# Patient Record
Sex: Female | Born: 1935 | Race: Black or African American | Hispanic: No | Smoking: Never smoker
Health system: Southern US, Community
[De-identification: ages and names within clinical notes are randomized; demographics above are authoritative.]

## PROBLEM LIST (undated history)

## (undated) DIAGNOSIS — R202 Paresthesia of skin: Secondary | ICD-10-CM

## (undated) DIAGNOSIS — R351 Nocturia: Secondary | ICD-10-CM

## (undated) DIAGNOSIS — M1711 Unilateral primary osteoarthritis, right knee: Secondary | ICD-10-CM

## (undated) DIAGNOSIS — Z972 Presence of dental prosthetic device (complete) (partial): Secondary | ICD-10-CM

## (undated) DIAGNOSIS — R200 Anesthesia of skin: Secondary | ICD-10-CM

## (undated) DIAGNOSIS — M8580 Other specified disorders of bone density and structure, unspecified site: Secondary | ICD-10-CM

## (undated) DIAGNOSIS — Z973 Presence of spectacles and contact lenses: Secondary | ICD-10-CM

## (undated) DIAGNOSIS — I10 Essential (primary) hypertension: Secondary | ICD-10-CM

## (undated) DIAGNOSIS — M1990 Unspecified osteoarthritis, unspecified site: Secondary | ICD-10-CM

## (undated) DIAGNOSIS — G629 Polyneuropathy, unspecified: Secondary | ICD-10-CM

## (undated) DIAGNOSIS — R7303 Prediabetes: Secondary | ICD-10-CM

## (undated) DIAGNOSIS — D649 Anemia, unspecified: Secondary | ICD-10-CM

## (undated) DIAGNOSIS — E7800 Pure hypercholesterolemia, unspecified: Secondary | ICD-10-CM

## (undated) DIAGNOSIS — K219 Gastro-esophageal reflux disease without esophagitis: Secondary | ICD-10-CM

## (undated) DIAGNOSIS — S060X9A Concussion with loss of consciousness of unspecified duration, initial encounter: Secondary | ICD-10-CM

## (undated) DIAGNOSIS — S060XAA Concussion with loss of consciousness status unknown, initial encounter: Secondary | ICD-10-CM

## (undated) DIAGNOSIS — E559 Vitamin D deficiency, unspecified: Secondary | ICD-10-CM

## (undated) DIAGNOSIS — J329 Chronic sinusitis, unspecified: Secondary | ICD-10-CM

## (undated) HISTORY — DX: Other specified disorders of bone density and structure, unspecified site: M85.80

## (undated) HISTORY — DX: Essential (primary) hypertension: I10

## (undated) HISTORY — DX: Unilateral primary osteoarthritis, right knee: M17.11

## (undated) HISTORY — DX: Vitamin D deficiency, unspecified: E55.9

## (undated) HISTORY — DX: Polyneuropathy, unspecified: G62.9

## (undated) HISTORY — DX: Anemia, unspecified: D64.9

## (undated) HISTORY — PX: BUNIONECTOMY: SHX129

## (undated) HISTORY — PX: BREAST SURGERY: SHX581

## (undated) HISTORY — DX: Chronic sinusitis, unspecified: J32.9

## (undated) HISTORY — PX: ABDOMINAL HYSTERECTOMY: SHX81

---

## 1978-11-26 HISTORY — PX: ABDOMINAL HYSTERECTOMY: SHX81

## 2014-09-23 DIAGNOSIS — M25562 Pain in left knee: Secondary | ICD-10-CM | POA: Insufficient documentation

## 2014-10-12 ENCOUNTER — Other Ambulatory Visit: Payer: Self-pay | Admitting: Orthopedic Surgery

## 2014-10-28 ENCOUNTER — Ambulatory Visit (HOSPITAL_COMMUNITY)
Admission: RE | Admit: 2014-10-28 | Discharge: 2014-10-28 | Disposition: A | Discharge status: Home / Self Care | Payer: Commercial Managed Care - HMO | Source: Ambulatory Visit | Attending: Orthopedic Surgery | Admitting: Orthopedic Surgery

## 2014-10-28 ENCOUNTER — Encounter (HOSPITAL_COMMUNITY): Payer: Self-pay

## 2014-10-28 ENCOUNTER — Encounter (HOSPITAL_COMMUNITY)
Admission: RE | Admit: 2014-10-28 | Discharge: 2014-10-28 | Disposition: A | Discharge status: Home / Self Care | Payer: Commercial Managed Care - HMO | Source: Ambulatory Visit | Attending: Orthopedic Surgery | Admitting: Orthopedic Surgery

## 2014-10-28 DIAGNOSIS — Z01818 Encounter for other preprocedural examination: Secondary | ICD-10-CM | POA: Diagnosis present

## 2014-10-28 DIAGNOSIS — I517 Cardiomegaly: Secondary | ICD-10-CM | POA: Insufficient documentation

## 2014-10-28 HISTORY — DX: Presence of dental prosthetic device (complete) (partial): Z97.2

## 2014-10-28 HISTORY — DX: Anesthesia of skin: R20.0

## 2014-10-28 HISTORY — DX: Essential (primary) hypertension: I10

## 2014-10-28 HISTORY — DX: Anesthesia of skin: R20.2

## 2014-10-28 HISTORY — DX: Nocturia: R35.1

## 2014-10-28 HISTORY — DX: Gastro-esophageal reflux disease without esophagitis: K21.9

## 2014-10-28 HISTORY — DX: Presence of spectacles and contact lenses: Z97.3

## 2014-10-28 HISTORY — DX: Concussion with loss of consciousness status unknown, initial encounter: S06.0XAA

## 2014-10-28 HISTORY — DX: Unspecified osteoarthritis, unspecified site: M19.90

## 2014-10-28 HISTORY — DX: Concussion with loss of consciousness of unspecified duration, initial encounter: S06.0X9A

## 2014-10-28 HISTORY — DX: Pure hypercholesterolemia, unspecified: E78.00

## 2014-10-28 LAB — URINALYSIS, ROUTINE W REFLEX MICROSCOPIC
BILIRUBIN URINE: NEGATIVE
Glucose, UA: NEGATIVE mg/dL
KETONES UR: NEGATIVE mg/dL
LEUKOCYTES UA: NEGATIVE
NITRITE: NEGATIVE
PH: 7 (ref 5.0–8.0)
PROTEIN: NEGATIVE mg/dL
Specific Gravity, Urine: 1.014 (ref 1.005–1.030)
UROBILINOGEN UA: 0.2 mg/dL (ref 0.0–1.0)

## 2014-10-28 LAB — COMPREHENSIVE METABOLIC PANEL
ALT: 18 U/L (ref 0–35)
AST: 19 U/L (ref 0–37)
Albumin: 3.7 g/dL (ref 3.5–5.2)
Alkaline Phosphatase: 79 U/L (ref 39–117)
Anion gap: 13 (ref 5–15)
BUN: 21 mg/dL (ref 6–23)
CALCIUM: 9.6 mg/dL (ref 8.4–10.5)
CO2: 25 mEq/L (ref 19–32)
CREATININE: 0.91 mg/dL (ref 0.50–1.10)
Chloride: 102 mEq/L (ref 96–112)
GFR calc non Af Amer: 58 mL/min — ABNORMAL LOW (ref >90)
GFR, EST AFRICAN AMERICAN: 67 mL/min — AB (ref >90)
GLUCOSE: 101 mg/dL — AB (ref 70–99)
Potassium: 4.1 mEq/L (ref 3.7–5.3)
SODIUM: 140 meq/L (ref 137–147)
Total Bilirubin: <0.2 mg/dL — ABNORMAL LOW (ref 0.3–1.2)
Total Protein: 7.2 g/dL (ref 6.0–8.3)

## 2014-10-28 LAB — SURGICAL PCR SCREEN
MRSA, PCR: NEGATIVE
STAPHYLOCOCCUS AUREUS: POSITIVE — AB

## 2014-10-28 LAB — CBC WITH DIFFERENTIAL/PLATELET
Basophils Absolute: 0 10*3/uL (ref 0.0–0.1)
Basophils Relative: 0 % (ref 0–1)
EOS PCT: 0 % (ref 0–5)
Eosinophils Absolute: 0 10*3/uL (ref 0.0–0.7)
HCT: 39.9 % (ref 36.0–46.0)
Hemoglobin: 13.1 g/dL (ref 12.0–15.0)
Lymphocytes Relative: 25 % (ref 12–46)
Lymphs Abs: 1.7 10*3/uL (ref 0.7–4.0)
MCH: 29.6 pg (ref 26.0–34.0)
MCHC: 32.8 g/dL (ref 30.0–36.0)
MCV: 90.3 fL (ref 78.0–100.0)
MONO ABS: 0.4 10*3/uL (ref 0.1–1.0)
Monocytes Relative: 5 % (ref 3–12)
Neutro Abs: 4.7 10*3/uL (ref 1.7–7.7)
Neutrophils Relative %: 70 % (ref 43–77)
PLATELETS: 184 10*3/uL (ref 150–400)
RBC: 4.42 MIL/uL (ref 3.87–5.11)
RDW: 13.2 % (ref 11.5–15.5)
WBC: 6.9 10*3/uL (ref 4.0–10.5)

## 2014-10-28 LAB — URINE MICROSCOPIC-ADD ON

## 2014-10-28 LAB — PROTIME-INR
INR: 0.9 (ref 0.00–1.49)
PROTHROMBIN TIME: 12.3 s (ref 11.6–15.2)

## 2014-10-28 LAB — APTT: aPTT: 32 seconds (ref 24–37)

## 2014-10-28 NOTE — Pre-Procedure Instructions (Signed)
Laura Rocha  10/28/2014   Your procedure is scheduled on: Monday, November 08, 2014  [facility] at 5:30 AM.  Call this number if you have problems the morning of surgery: [phone]   Remember:   Do not eat food or drink liquids after midnight Sunday, November 07, 2014   Take these medicines the morning of surgery with A SIP OF WATER: amlodipine Stop taking Aspirin, vitamins and herbal medications. Do not take any NSAIDs ie: Ibuprofen, Advil, Naproxen or any medication containing Aspirin;stop 5 days prior to procedure ( Wednesday, November 03, 2014).  Do not wear jewelry, make-up or nail polish.  Do not wear lotions, powders, or perfumes. You may not wear deodorant.  Do not shave 48 hours prior to surgery.   Do not bring valuables to the hospital.  [facility] is not responsible for any belongings or valuables.               Contacts, dentures or bridgework may not be worn into surgery.  Leave suitcase in the car. After surgery it may be brought to your room.  For patients admitted to the hospital, discharge time is determined by your treatment team.               Patients discharged the day of surgery will not be allowed to drive home.  Name and phone number of your driver:   Special Instructions:  Special Instructions:Special Instructions:  - Preparing for Surgery  Before surgery, you can play an important role.  Because skin is not sterile, your skin needs to be as free of germs as possible.  You can reduce the number of germs on you skin by washing with CHG (chlorahexidine gluconate) soap before surgery.  CHG is an antiseptic cleaner which kills germs and bonds with the skin to continue killing germs even after washing.  Please DO NOT use if you have an allergy to CHG or antibacterial soaps.  If your skin becomes reddened/irritated stop using the CHG and inform your nurse when you arrive at Short Stay.  Do not shave (including legs and  underarms) for at least 48 hours prior to the first CHG shower.  You may shave your face.  Please follow these instructions carefully:   1.  Shower with CHG Soap the night before surgery and the morning of Surgery.  2.  If you choose to wash your hair, wash your hair first as usual with your normal shampoo.  3.  After you shampoo, rinse your hair and body thoroughly to remove the Shampoo.  4.  Use CHG as you would any other liquid soap.  You can apply chg directly  to the skin and wash gently with scrungie or a clean washcloth.  5.  Apply the CHG Soap to your body ONLY FROM THE NECK DOWN.  Do not use on open wounds or open sores.  Avoid contact with your eyes, ears, mouth and genitals (private parts).  Wash genitals (private parts) with your normal soap.  6.  Wash thoroughly, paying special attention to the area where your surgery will be performed.  7.  Thoroughly rinse your body with warm water from the neck down.  8.  DO NOT shower/wash with your normal soap after using and rinsing off the CHG Soap.  9.  Pat yourself dry with a clean towel.            10 .  Wear clean pajamas.  11.  Place clean sheets on your bed the night of your first shower and do not sleep with pets.  Day of Surgery  Do not apply any lotions/deodorants the morning of surgery.  Please wear clean clothes to the hospital/surgery center.   Please read over the following fact sheets that you were given: Pain Booklet, Coughing and Deep Breathing, Blood Transfusion Information, Total Joint Packet, MRSA Information and Surgical Site Infection Prevention

## 2014-10-28 NOTE — Progress Notes (Signed)
Pt denies SOB, chest pain, and being under the care of a cardiologist. Pt denies having a chest x ray, EKG, stress test, echo and cardiac cath.

## 2014-10-29 DIAGNOSIS — M1712 Unilateral primary osteoarthritis, left knee: Secondary | ICD-10-CM | POA: Insufficient documentation

## 2014-10-29 LAB — URINE CULTURE
CULTURE: NO GROWTH
Colony Count: NO GROWTH

## 2014-10-29 MED ORDER — NITROGLYCERIN 1 MG/10 ML FOR IR/CATH LAB
INTRA_ARTERIAL | Status: AC
  Filled 2014-10-29: qty 10

## 2014-10-29 MED ORDER — MIDAZOLAM HCL 2 MG/2ML IJ SOLN
INTRAMUSCULAR | Status: AC
  Filled 2014-10-29: qty 2

## 2014-10-29 MED ORDER — HEPARIN SODIUM (PORCINE) 1000 UNIT/ML IJ SOLN
INTRAMUSCULAR | Status: AC
  Filled 2014-10-29: qty 1

## 2014-10-29 MED ORDER — LIDOCAINE HCL (PF) 1 % IJ SOLN
INTRAMUSCULAR | Status: AC
  Filled 2014-10-29: qty 30

## 2014-10-29 MED ORDER — VERAPAMIL HCL 2.5 MG/ML IV SOLN
INTRAVENOUS | Status: AC
  Filled 2014-10-29: qty 2

## 2014-10-29 MED ORDER — HEPARIN (PORCINE) IN NACL 2-0.9 UNIT/ML-% IJ SOLN
INTRAMUSCULAR | Status: AC
  Filled 2014-10-29: qty 1000

## 2014-10-29 MED ORDER — FENTANYL CITRATE 0.05 MG/ML IJ SOLN
INTRAMUSCULAR | Status: AC
  Filled 2014-10-29: qty 2

## 2014-11-07 MED ORDER — CEFAZOLIN SODIUM-DEXTROSE 2-3 GM-% IV SOLR
2.0000 g | INTRAVENOUS | Status: AC
  Administered 2014-11-08: 2 g via INTRAVENOUS
  Filled 2014-11-07: qty 50

## 2014-11-07 MED ORDER — TRANEXAMIC ACID 100 MG/ML IV SOLN
1000.0000 mg | INTRAVENOUS | Status: AC
  Administered 2014-11-08: 1000 mg via INTRAVENOUS
  Filled 2014-11-07: qty 10

## 2014-11-08 ENCOUNTER — Inpatient Hospital Stay (HOSPITAL_COMMUNITY): Payer: Medicare HMO | Admitting: Anesthesiology

## 2014-11-08 ENCOUNTER — Inpatient Hospital Stay (HOSPITAL_COMMUNITY)
Admission: RE | Admit: 2014-11-08 | Discharge: 2014-11-09 | DRG: 470 | Disposition: A | Discharge status: Home / Self Care | Payer: Medicare HMO | Source: Ambulatory Visit | Attending: Orthopedic Surgery | Admitting: Orthopedic Surgery

## 2014-11-08 ENCOUNTER — Encounter (HOSPITAL_COMMUNITY)
Admission: RE | Disposition: A | Discharge status: Home / Self Care | Payer: Self-pay | Source: Ambulatory Visit | Attending: Orthopedic Surgery

## 2014-11-08 DIAGNOSIS — M1712 Unilateral primary osteoarthritis, left knee: Principal | ICD-10-CM | POA: Diagnosis present

## 2014-11-08 DIAGNOSIS — E780 Pure hypercholesterolemia: Secondary | ICD-10-CM | POA: Diagnosis present

## 2014-11-08 DIAGNOSIS — K219 Gastro-esophageal reflux disease without esophagitis: Secondary | ICD-10-CM | POA: Diagnosis present

## 2014-11-08 DIAGNOSIS — D62 Acute posthemorrhagic anemia: Secondary | ICD-10-CM | POA: Diagnosis not present

## 2014-11-08 DIAGNOSIS — I10 Essential (primary) hypertension: Secondary | ICD-10-CM | POA: Diagnosis present

## 2014-11-08 DIAGNOSIS — M25562 Pain in left knee: Secondary | ICD-10-CM | POA: Diagnosis present

## 2014-11-08 DIAGNOSIS — Z96659 Presence of unspecified artificial knee joint: Secondary | ICD-10-CM

## 2014-11-08 HISTORY — PX: TOTAL KNEE ARTHROPLASTY: SHX125

## 2014-11-08 LAB — CBC
HCT: 36.3 % (ref 36.0–46.0)
Hemoglobin: 12.3 g/dL (ref 12.0–15.0)
MCH: 31.1 pg (ref 26.0–34.0)
MCHC: 33.9 g/dL (ref 30.0–36.0)
MCV: 91.9 fL (ref 78.0–100.0)
PLATELETS: 161 10*3/uL (ref 150–400)
RBC: 3.95 MIL/uL (ref 3.87–5.11)
RDW: 13.3 % (ref 11.5–15.5)
WBC: 10.4 10*3/uL (ref 4.0–10.5)

## 2014-11-08 LAB — CREATININE, SERUM
CREATININE: 0.65 mg/dL (ref 0.50–1.10)
GFR calc Af Amer: >90 mL/min (ref >90)
GFR calc non Af Amer: 82 mL/min — ABNORMAL LOW (ref >90)

## 2014-11-08 SURGERY — ARTHROPLASTY, KNEE, TOTAL
Anesthesia: Monitor Anesthesia Care | Site: Knee | Laterality: Left

## 2014-11-08 MED ORDER — OXYCODONE HCL 5 MG PO TABS
5.0000 mg | ORAL_TABLET | ORAL | Status: DC | PRN
  Administered 2014-11-08 (×2): 5 mg via ORAL
  Administered 2014-11-09: 10 mg via ORAL
  Administered 2014-11-09: 5 mg via ORAL
  Administered 2014-11-09 (×2): 10 mg via ORAL
  Filled 2014-11-08: qty 2
  Filled 2014-11-08: qty 1
  Filled 2014-11-08: qty 2
  Filled 2014-11-08 (×2): qty 1
  Filled 2014-11-08: qty 2

## 2014-11-08 MED ORDER — DOCUSATE SODIUM 100 MG PO CAPS
100.0000 mg | ORAL_CAPSULE | Freq: Two times a day (BID) | ORAL | Status: DC
  Administered 2014-11-09: 100 mg via ORAL
  Filled 2014-11-08 (×2): qty 1

## 2014-11-08 MED ORDER — DIPHENHYDRAMINE HCL 12.5 MG/5ML PO ELIX: 12.5000 mg | ORAL_SOLUTION | ORAL | Status: DC | PRN

## 2014-11-08 MED ORDER — PROMETHAZINE HCL 25 MG/ML IJ SOLN
6.2500 mg | INTRAMUSCULAR | Status: DC | PRN
  Administered 2014-11-08: 6.25 mg via INTRAVENOUS

## 2014-11-08 MED ORDER — CELECOXIB 200 MG PO CAPS
200.0000 mg | ORAL_CAPSULE | Freq: Two times a day (BID) | ORAL | Status: DC
  Administered 2014-11-08 – 2014-11-09 (×2): 200 mg via ORAL
  Filled 2014-11-08 (×3): qty 1

## 2014-11-08 MED ORDER — METHOCARBAMOL 1000 MG/10ML IJ SOLN
500.0000 mg | Freq: Four times a day (QID) | INTRAVENOUS | Status: DC | PRN
  Filled 2014-11-08: qty 5

## 2014-11-08 MED ORDER — FENTANYL CITRATE 0.05 MG/ML IJ SOLN
INTRAMUSCULAR | Status: AC
  Filled 2014-11-08: qty 5

## 2014-11-08 MED ORDER — SODIUM CHLORIDE 0.9 % IV SOLN
INTRAVENOUS | Status: DC
  Administered 2014-11-09: 06:00:00 via INTRAVENOUS

## 2014-11-08 MED ORDER — ACETAMINOPHEN 325 MG PO TABS: 650.0000 mg | ORAL_TABLET | Freq: Four times a day (QID) | ORAL | Status: DC | PRN

## 2014-11-08 MED ORDER — METHOCARBAMOL 500 MG PO TABS
500.0000 mg | ORAL_TABLET | Freq: Four times a day (QID) | ORAL | Status: DC | PRN
  Administered 2014-11-08: 500 mg via ORAL
  Filled 2014-11-08 (×2): qty 1

## 2014-11-08 MED ORDER — CHLORHEXIDINE GLUCONATE 4 % EX LIQD
60.0000 mL | Freq: Once | CUTANEOUS | Status: DC
Start: 2014-11-08 — End: 2014-11-08

## 2014-11-08 MED ORDER — METOCLOPRAMIDE HCL 5 MG/ML IJ SOLN
INTRAMUSCULAR | Status: AC
  Filled 2014-11-08: qty 2

## 2014-11-08 MED ORDER — AMLODIPINE BESYLATE 10 MG PO TABS
10.0000 mg | ORAL_TABLET | Freq: Every day | ORAL | Status: DC
  Administered 2014-11-09: 10 mg via ORAL
  Filled 2014-11-08: qty 1

## 2014-11-08 MED ORDER — ZOLPIDEM TARTRATE 5 MG PO TABS: 5.0000 mg | ORAL_TABLET | Freq: Every evening | ORAL | Status: DC | PRN

## 2014-11-08 MED ORDER — BISACODYL 5 MG PO TBEC: 5.0000 mg | DELAYED_RELEASE_TABLET | Freq: Every day | ORAL | Status: DC | PRN

## 2014-11-08 MED ORDER — PROPOFOL 10 MG/ML IV BOLUS
INTRAVENOUS | Status: AC
  Filled 2014-11-08: qty 20

## 2014-11-08 MED ORDER — ONDANSETRON HCL 4 MG/2ML IJ SOLN
INTRAMUSCULAR | Status: DC | PRN
  Administered 2014-11-08: 4 mg via INTRAVENOUS

## 2014-11-08 MED ORDER — PHENOL 1.4 % MT LIQD: 1.0000 | OROMUCOSAL | Status: DC | PRN

## 2014-11-08 MED ORDER — METOCLOPRAMIDE HCL 5 MG/ML IJ SOLN: 5.0000 mg | Freq: Once | INTRAMUSCULAR | Status: DC

## 2014-11-08 MED ORDER — FLEET ENEMA 7-19 GM/118ML RE ENEM: 1.0000 | ENEMA | Freq: Once | RECTAL | Status: AC | PRN

## 2014-11-08 MED ORDER — HYDROMORPHONE HCL 1 MG/ML IJ SOLN: 1.0000 mg | INTRAMUSCULAR | Status: DC | PRN

## 2014-11-08 MED ORDER — ROCURONIUM BROMIDE 50 MG/5ML IV SOLN
INTRAVENOUS | Status: AC
  Filled 2014-11-08: qty 1

## 2014-11-08 MED ORDER — LACTATED RINGERS IV SOLN
INTRAVENOUS | Status: DC | PRN
  Administered 2014-11-08 (×2): via INTRAVENOUS

## 2014-11-08 MED ORDER — METOCLOPRAMIDE HCL 5 MG PO TABS
5.0000 mg | ORAL_TABLET | Freq: Three times a day (TID) | ORAL | Status: DC | PRN
Start: 2014-11-08 — End: 2014-11-09
  Filled 2014-11-08: qty 2

## 2014-11-08 MED ORDER — SODIUM CHLORIDE 0.9 % IR SOLN
Status: DC | PRN
  Administered 2014-11-08: 3000 mL

## 2014-11-08 MED ORDER — HYDROMORPHONE HCL 1 MG/ML IJ SOLN
INTRAMUSCULAR | Status: AC
  Administered 2014-11-08: 14:00:00
  Filled 2014-11-08: qty 1

## 2014-11-08 MED ORDER — FENTANYL CITRATE 0.05 MG/ML IJ SOLN
INTRAMUSCULAR | Status: DC | PRN
  Administered 2014-11-08 (×2): 25 ug via INTRAVENOUS
  Administered 2014-11-08 (×2): 50 ug via INTRAVENOUS

## 2014-11-08 MED ORDER — BUPIVACAINE-EPINEPHRINE 0.5% -1:200000 IJ SOLN
INTRAMUSCULAR | Status: DC | PRN
  Administered 2014-11-08: 20 mL

## 2014-11-08 MED ORDER — ACETAMINOPHEN 650 MG RE SUPP: 650.0000 mg | Freq: Four times a day (QID) | RECTAL | Status: DC | PRN

## 2014-11-08 MED ORDER — SENNOSIDES-DOCUSATE SODIUM 8.6-50 MG PO TABS: 1.0000 | ORAL_TABLET | Freq: Every evening | ORAL | Status: DC | PRN

## 2014-11-08 MED ORDER — ONDANSETRON HCL 4 MG PO TABS
4.0000 mg | ORAL_TABLET | Freq: Four times a day (QID) | ORAL | Status: DC | PRN
  Filled 2014-11-08: qty 1

## 2014-11-08 MED ORDER — BUPIVACAINE LIPOSOME 1.3 % IJ SUSP
20.0000 mL | Freq: Once | INTRAMUSCULAR | Status: AC
  Administered 2014-11-08: 20 mL
  Filled 2014-11-08: qty 20

## 2014-11-08 MED ORDER — LIDOCAINE HCL (CARDIAC) 20 MG/ML IV SOLN
INTRAVENOUS | Status: AC
  Filled 2014-11-08: qty 5

## 2014-11-08 MED ORDER — BUPIVACAINE LIPOSOME 1.3 % IJ SUSP: 20.0000 mL | Freq: Once | INTRAMUSCULAR | Status: DC

## 2014-11-08 MED ORDER — PROPOFOL INFUSION 10 MG/ML OPTIME
INTRAVENOUS | Status: DC | PRN
  Administered 2014-11-08: 50 ug/kg/min via INTRAVENOUS

## 2014-11-08 MED ORDER — CEFAZOLIN SODIUM 1-5 GM-% IV SOLN
1.0000 g | Freq: Four times a day (QID) | INTRAVENOUS | Status: AC
  Administered 2014-11-08 (×2): 1 g via INTRAVENOUS
  Filled 2014-11-08 (×2): qty 50

## 2014-11-08 MED ORDER — PROMETHAZINE HCL 25 MG/ML IJ SOLN
INTRAMUSCULAR | Status: AC
  Administered 2014-11-08: 13:00:00
  Filled 2014-11-08: qty 1

## 2014-11-08 MED ORDER — ENOXAPARIN SODIUM 30 MG/0.3ML ~~LOC~~ SOLN
30.0000 mg | Freq: Two times a day (BID) | SUBCUTANEOUS | Status: DC
  Administered 2014-11-09: 30 mg via SUBCUTANEOUS
  Filled 2014-11-08 (×3): qty 0.3

## 2014-11-08 MED ORDER — SODIUM CHLORIDE 0.9 % IV SOLN: INTRAVENOUS | Status: DC

## 2014-11-08 MED ORDER — CHLORHEXIDINE GLUCONATE 4 % EX LIQD: 60.0000 mL | Freq: Once | CUTANEOUS | Status: DC

## 2014-11-08 MED ORDER — ONDANSETRON HCL 4 MG/2ML IJ SOLN
4.0000 mg | Freq: Four times a day (QID) | INTRAMUSCULAR | Status: DC | PRN
  Administered 2014-11-08: 4 mg via INTRAVENOUS
  Filled 2014-11-08 (×2): qty 2

## 2014-11-08 MED ORDER — ONDANSETRON HCL 4 MG/2ML IJ SOLN
INTRAMUSCULAR | Status: AC
  Filled 2014-11-08: qty 2

## 2014-11-08 MED ORDER — METOCLOPRAMIDE HCL 5 MG/ML IJ SOLN
5.0000 mg | Freq: Three times a day (TID) | INTRAMUSCULAR | Status: DC | PRN
  Filled 2014-11-08: qty 2

## 2014-11-08 MED ORDER — ALUM & MAG HYDROXIDE-SIMETH 200-200-20 MG/5ML PO SUSP
30.0000 mL | ORAL | Status: DC | PRN
Start: 2014-11-08 — End: 2014-11-09

## 2014-11-08 MED ORDER — MENTHOL 3 MG MT LOZG: 1.0000 | LOZENGE | OROMUCOSAL | Status: DC | PRN

## 2014-11-08 MED ORDER — BUPIVACAINE-EPINEPHRINE (PF) 0.5% -1:200000 IJ SOLN
INTRAMUSCULAR | Status: AC
  Filled 2014-11-08: qty 30

## 2014-11-08 MED ORDER — OXYCODONE HCL ER 10 MG PO T12A
10.0000 mg | EXTENDED_RELEASE_TABLET | Freq: Two times a day (BID) | ORAL | Status: DC
  Administered 2014-11-09: 10 mg via ORAL
  Filled 2014-11-08: qty 1

## 2014-11-08 MED ORDER — HYDROMORPHONE HCL 1 MG/ML IJ SOLN
0.2500 mg | INTRAMUSCULAR | Status: DC | PRN
  Administered 2014-11-08: 0.5 mg via INTRAVENOUS

## 2014-11-08 SURGICAL SUPPLY — 58 items
BANDAGE ESMARK 6X9 LF (GAUZE/BANDAGES/DRESSINGS) ×1 IMPLANT
BLADE SAGITTAL 13X1.27X60 (BLADE) ×2 IMPLANT
BLADE SAW SGTL 83.5X18.5 (BLADE) ×2 IMPLANT
BLADE SURG 10 STRL SS (BLADE) ×2 IMPLANT
BNDG ESMARK 6X9 LF (GAUZE/BANDAGES/DRESSINGS) ×2
BOWL SMART MIX CTS (DISPOSABLE) ×2 IMPLANT
CAPT KNEE TOTAL 3 ×2 IMPLANT
CEMENT BONE SIMPLEX SPEEDSET (Cement) ×4 IMPLANT
COVER SURGICAL LIGHT HANDLE (MISCELLANEOUS) ×2 IMPLANT
CUFF TOURNIQUET SINGLE 34IN LL (TOURNIQUET CUFF) ×2 IMPLANT
DRAPE EXTREMITY T 121X128X90 (DRAPE) ×2 IMPLANT
DRAPE IMP U-DRAPE 54X76 (DRAPES) ×2 IMPLANT
DRAPE INCISE IOBAN 66X45 STRL (DRAPES) ×4 IMPLANT
DRAPE PROXIMA HALF (DRAPES) IMPLANT
DRAPE U-SHAPE 47X51 STRL (DRAPES) ×2 IMPLANT
DRSG ADAPTIC 3X8 NADH LF (GAUZE/BANDAGES/DRESSINGS) ×2 IMPLANT
DRSG PAD ABDOMINAL 8X10 ST (GAUZE/BANDAGES/DRESSINGS) ×2 IMPLANT
DURAPREP 26ML APPLICATOR (WOUND CARE) ×4 IMPLANT
ELECT REM PT RETURN 9FT ADLT (ELECTROSURGICAL) ×2
ELECTRODE REM PT RTRN 9FT ADLT (ELECTROSURGICAL) ×1 IMPLANT
EVACUATOR 1/8 PVC DRAIN (DRAIN) ×2 IMPLANT
GAUZE SPONGE 4X4 12PLY STRL (GAUZE/BANDAGES/DRESSINGS) ×2 IMPLANT
GLOVE BIOGEL M 7.0 STRL (GLOVE) IMPLANT
GLOVE BIOGEL PI IND STRL 7.5 (GLOVE) IMPLANT
GLOVE BIOGEL PI IND STRL 8.5 (GLOVE) ×2 IMPLANT
GLOVE BIOGEL PI INDICATOR 7.5 (GLOVE)
GLOVE BIOGEL PI INDICATOR 8.5 (GLOVE) ×2
GLOVE SURG ORTHO 8.0 STRL STRW (GLOVE) ×4 IMPLANT
GOWN STRL REUS W/ TWL LRG LVL3 (GOWN DISPOSABLE) ×1 IMPLANT
GOWN STRL REUS W/ TWL XL LVL3 (GOWN DISPOSABLE) ×2 IMPLANT
GOWN STRL REUS W/TWL LRG LVL3 (GOWN DISPOSABLE) ×1
GOWN STRL REUS W/TWL XL LVL3 (GOWN DISPOSABLE) ×2
HANDPIECE INTERPULSE COAX TIP (DISPOSABLE) ×1
HOOD PEEL AWAY FACE SHEILD DIS (HOOD) ×6 IMPLANT
KIT BASIN OR (CUSTOM PROCEDURE TRAY) ×2 IMPLANT
KIT ROOM TURNOVER OR (KITS) ×2 IMPLANT
KNEE CAPITATED TOTAL 3 ×1 IMPLANT
MANIFOLD NEPTUNE II (INSTRUMENTS) ×2 IMPLANT
NEEDLE 22X1 1/2 (OR ONLY) (NEEDLE) ×4 IMPLANT
NS IRRIG 1000ML POUR BTL (IV SOLUTION) ×2 IMPLANT
PACK TOTAL JOINT (CUSTOM PROCEDURE TRAY) ×2 IMPLANT
PACK UNIVERSAL I (CUSTOM PROCEDURE TRAY) ×2 IMPLANT
PAD ARMBOARD 7.5X6 YLW CONV (MISCELLANEOUS) ×4 IMPLANT
PADDING CAST COTTON 6X4 STRL (CAST SUPPLIES) ×2 IMPLANT
SET HNDPC FAN SPRY TIP SCT (DISPOSABLE) ×1 IMPLANT
SPONGE GAUZE 4X4 12PLY STER LF (GAUZE/BANDAGES/DRESSINGS) ×2 IMPLANT
STAPLER VISISTAT 35W (STAPLE) ×2 IMPLANT
SUCTION FRAZIER TIP 10 FR DISP (SUCTIONS) ×2 IMPLANT
SUT BONE WAX W31G (SUTURE) ×2 IMPLANT
SUT VIC AB 0 CTB1 27 (SUTURE) ×4 IMPLANT
SUT VIC AB 1 CT1 27 (SUTURE) ×2
SUT VIC AB 1 CT1 27XBRD ANBCTR (SUTURE) ×2 IMPLANT
SUT VIC AB 2-0 CT1 27 (SUTURE) ×2
SUT VIC AB 2-0 CT1 TAPERPNT 27 (SUTURE) ×2 IMPLANT
SYR 20CC LL (SYRINGE) ×4 IMPLANT
TOWEL OR 17X24 6PK STRL BLUE (TOWEL DISPOSABLE) ×2 IMPLANT
TOWEL OR 17X26 10 PK STRL BLUE (TOWEL DISPOSABLE) ×2 IMPLANT
WATER STERILE IRR 1000ML POUR (IV SOLUTION) ×4 IMPLANT

## 2014-11-08 NOTE — Transfer of Care (Signed)
Immediate Anesthesia Transfer of Care Note  Patient: Laura IceElizabeth Rocha  Procedure(s) Performed: Procedure(s): LEFT TOTAL KNEE ARTHROPLASTY (Left)  Patient Location: PACU  Anesthesia Type:MAC and Spinal  Level of Consciousness: awake, alert  and oriented  Airway & Oxygen Therapy: Patient Spontanous Breathing  Post-op Assessment: Report given to PACU RN  Post vital signs: Reviewed and stable  Complications: No apparent anesthesia complications

## 2014-11-08 NOTE — Anesthesia Preprocedure Evaluation (Signed)
Anesthesia Evaluation  Patient identified by MRN, date of birth, ID band Patient awake    Reviewed: Allergy & Precautions, H&P , NPO status , Patient's Chart, lab work & pertinent test results  Airway Mallampati: II  TM Distance: >3 FB Neck ROM: Full    Dental no notable dental hx.    Pulmonary neg pulmonary ROS,  breath sounds clear to auscultation  Pulmonary exam normal       Cardiovascular hypertension, Rhythm:Regular Rate:Normal     Neuro/Psych negative neurological ROS  negative psych ROS   GI/Hepatic negative GI ROS, Neg liver ROS,   Endo/Other  negative endocrine ROS  Renal/GU negative Renal ROS  negative genitourinary   Musculoskeletal negative musculoskeletal ROS (+)   Abdominal   Peds negative pediatric ROS (+)  Hematology negative hematology ROS (+)   Anesthesia Other Findings   Reproductive/Obstetrics negative OB ROS                             Anesthesia Physical Anesthesia Plan  ASA: II  Anesthesia Plan: Spinal   Post-op Pain Management:    Induction: Intravenous  Airway Management Planned: Nasal Cannula  Additional Equipment:   Intra-op Plan:   Post-operative Plan:   Informed Consent: I have reviewed the patients History and Physical, chart, labs and discussed the procedure including the risks, benefits and alternatives for the proposed anesthesia with the patient or authorized representative who has indicated his/her understanding and acceptance.   Dental advisory given  Plan Discussed with: CRNA  Anesthesia Plan Comments:         Anesthesia Quick Evaluation

## 2014-11-08 NOTE — Anesthesia Procedure Notes (Signed)
Spinal Patient location during procedure: OR Staffing Performed by: anesthesiologist  Preanesthetic Checklist Completed: patient identified, site marked, surgical consent, pre-op evaluation, timeout performed, IV checked, risks and benefits discussed and monitors and equipment checked Spinal Block Patient position: sitting Prep: Betadine Patient monitoring: heart rate, continuous pulse ox and blood pressure Injection technique: single-shot Needle Needle type: Sprotte  Needle gauge: 22 G Needle length: 9 cm Additional Notes Expiration date of kit checked and confirmed. Patient tolerated procedure well, without complications.   Attempt x 3

## 2014-11-08 NOTE — Anesthesia Postprocedure Evaluation (Signed)
  Anesthesia Post-op Note  Patient: Laura Rocha Case  Procedure(s) Performed: Procedure(s) (LRB): LEFT TOTAL KNEE ARTHROPLASTY (Left)  Patient Location: PACU  Anesthesia Type: Spinal  Level of Consciousness: awake and alert   Airway and Oxygen Therapy: Patient Spontanous Breathing  Post-op Pain: mild  Post-op Assessment: Post-op Vital signs reviewed, Patient's Cardiovascular Status Stable, Respiratory Function Stable, Patent Airway and No signs of Nausea or vomiting  Last Vitals:  Filed Vitals:   11/08/14 1000  BP:   Pulse: 77  Temp:   Resp: 15    Post-op Vital Signs: stable   Complications: No apparent anesthesia complications

## 2014-11-08 NOTE — Progress Notes (Signed)
Utilization review completed.  
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
## 2014-11-08 NOTE — Progress Notes (Signed)
Orthopedic Tech Progress Note Patient Details:  Laura Rocha [birth year] [id number] CPM applied to LLE with appropriate settings. OHF applied to bed. Footsie roll provided.  CPM Left Knee CPM Left Knee: On Left Knee Flexion (Degrees): 90 Left Knee Extension (Degrees): 0   Asia R Thompson 11/08/2014, 10:25 AM

## 2014-11-09 ENCOUNTER — Encounter (HOSPITAL_COMMUNITY): Payer: Self-pay | Admitting: General Practice

## 2014-11-09 LAB — CBC
HCT: 34.9 % — ABNORMAL LOW (ref 36.0–46.0)
HEMOGLOBIN: 11.5 g/dL — AB (ref 12.0–15.0)
MCH: 29.6 pg (ref 26.0–34.0)
MCHC: 33 g/dL (ref 30.0–36.0)
MCV: 89.7 fL (ref 78.0–100.0)
Platelets: 168 10*3/uL (ref 150–400)
RBC: 3.89 MIL/uL (ref 3.87–5.11)
RDW: 13.6 % (ref 11.5–15.5)
WBC: 11.3 10*3/uL — ABNORMAL HIGH (ref 4.0–10.5)

## 2014-11-09 LAB — BASIC METABOLIC PANEL
ANION GAP: 15 (ref 5–15)
BUN: 12 mg/dL (ref 6–23)
CHLORIDE: 100 meq/L (ref 96–112)
CO2: 25 mEq/L (ref 19–32)
Calcium: 9.2 mg/dL (ref 8.4–10.5)
Creatinine, Ser: 0.7 mg/dL (ref 0.50–1.10)
GFR calc non Af Amer: 80 mL/min — ABNORMAL LOW (ref >90)
Glucose, Bld: 113 mg/dL — ABNORMAL HIGH (ref 70–99)
Potassium: 3.5 mEq/L — ABNORMAL LOW (ref 3.7–5.3)
Sodium: 140 mEq/L (ref 137–147)

## 2014-11-09 MED ORDER — ENOXAPARIN SODIUM 40 MG/0.4ML ~~LOC~~ SOLN: 40.0000 mg | SUBCUTANEOUS | Status: DC

## 2014-11-09 MED ORDER — IBUPROFEN 600 MG PO TABS: 600.0000 mg | ORAL_TABLET | Freq: Three times a day (TID) | ORAL | Status: DC

## 2014-11-09 MED ORDER — OXYCODONE HCL 5 MG PO TABS: 5.0000 mg | ORAL_TABLET | ORAL | Status: DC | PRN

## 2014-11-09 MED ORDER — METHOCARBAMOL 500 MG PO TABS: 500.0000 mg | ORAL_TABLET | Freq: Four times a day (QID) | ORAL | Status: DC | PRN

## 2014-11-09 NOTE — Op Note (Signed)
TOTAL KNEE REPLACEMENT OPERATIVE NOTE:  11/08/2014  1:14 PM  PATIENT:  Laura Rocha  80 y.o. female  PRE-OPERATIVE DIAGNOSIS:  primary osteoarthritis left knee  POST-OPERATIVE DIAGNOSIS:  primary osteoarthritis left knee  PROCEDURE:  Procedure(s): LEFT TOTAL KNEE ARTHROPLASTY  SURGEON:  Surgeon(s): Dannielle HuhSteve Essa Wenk, MD  PHYSICIAN ASSISTANT: Altamese CabalMaurice Jones, [facility]  ANESTHESIA:   spinal  DRAINS: Hemovac  SPECIMEN: None  COUNTS:  Correct  TOURNIQUET:   Total Tourniquet Time Documented: Thigh (Left) - 52 minutes Total: Thigh (Left) - 52 minutes   DICTATION:  Indication for procedure:    The patient is a 80 y.o. female who has failed conservative treatment for primary osteoarthritis left knee.  Informed consent was obtained prior to anesthesia. The risks versus benefits of the operation were explain and in a way the patient can, and did, understand.   On the implant demand matching protocol, this patient scored 8.  Therefore, this patient was not receive a polyethylene insert with vitamin E which is a high demand implant.  Description of procedure:     The patient was taken to the operating room and placed under anesthesia.  The patient was positioned in the usual fashion taking care that all body parts were adequately padded and/or protected.  I foley catheter was not placed.  A tourniquet was applied and the leg prepped and draped in the usual sterile fashion.  The extremity was exsanguinated with the esmarch and tourniquet inflated to 350 mmHg.  Pre-operative range of motion was normal.  The knee was in 5 degree of mild varus.  A midline incision approximately 6-7 inches long was made with a #10 blade.  A new blade was used to make a parapatellar arthrotomy going 2-3 cm into the quadriceps tendon, over the patella, and alongside the medial aspect of the patellar tendon.  A synovectomy was then performed with the #10 blade and forceps. I then elevated the deep MCL off the medial  tibial metaphysis subperiosteally around to the semimembranosus attachment.    I everted the patella and used calipers to measure patellar thickness.  I used the reamer to ream down to appropriate thickness to recreate the native thickness.  I then removed excess bone with the rongeur and sagittal saw.  I used the appropriately sized template and drilled the three lug holes.  I then put the trial in place and measured the thickness with the calipers to ensure recreation of the native thickness.  The trial was then removed and the patella subluxed and the knee brought into flexion.  A homan retractor was place to retract and protect the patella and lateral structures.  A Z-retractor was place medially to protect the medial structures.  The extra-medullary alignment system was used to make cut the tibial articular surface perpendicular to the anamotic axis of the tibia and in 3 degrees of posterior slope.  The cut surface and alignment jig was removed.  I then used the intramedullary alignment guide to make a 6 valgus cut on the distal femur.  I then marked out the epicondylar axis on the distal femur.  The posterior condylar axis measured 3 degrees.  I then used the anterior referencing sizer and measured the femur to be a size 7.  The 4-In-1 cutting block was screwed into place in external rotation matching the posterior condylar angle, making our cuts perpendicular to the epicondylar axis.  Anterior, posterior and chamfer cuts were made with the sagittal saw.  The cutting block and cut pieces were  removed.  A lamina spreader was placed in 90 degrees of flexion.  The ACL, PCL, menisci, and posterior condylar osteophytes were removed.  A 10 mm spacer blocked was found to offer good flexion and extension gap balance after mild in degree releasing.   The scoop retractor was then placed and the femoral finishing block was pinned in place.  The small sagittal saw was used as well as the lug drill to finish the  femur.  The block and cut surfaces were removed and the medullary canal hole filled with autograft bone from the cut pieces.  The tibia was delivered forward in deep flexion and external rotation.  A size D tray was selected and pinned into place centered on the medial 1/3 of the tibial tubercle.  The reamer and keel was used to prepare the tibia through the tray.    I then trialed with the size 7 femur, size D tibia, a 10 mm insert and the 32 patella.  I had excellent flexion/extension gap balance, excellent patella tracking.  Flexion was full and beyond 120 degrees; extension was zero.  These components were chosen and the staff opened them to me on the back table while the knee was lavaged copiously and the cement mixed.  The soft tissue was infiltrated with 60cc of exparel 1.3% through a 21 gauge needle.  I cemented in the components and removed all excess cement.  The polyethylene tibial component was snapped into place and the knee placed in extension while cement was hardening.  The capsule was infilltrated with 30cc of .25% Marcaine with epinephrine.  A hemovac was place in the joint exiting superolaterally.  A pain pump was place superomedially superficial to the arthrotomy.  Once the cement was hard, the tourniquet was let down.  Hemostasis was obtained.  The arthrotomy was closed with figure-8 #1 vicryl sutures.  The deep soft tissues were closed with #0 vicryls and the subcuticular layer closed with a running #2-0 vicryl.  The skin was reapproximated and closed with skin staples.  The wound was dressed with xeroform, 4 x4's, 2 ABD sponges, a single layer of webril and a TED stocking.   The patient was then awakened, extubated, and taken to the recovery room in stable condition.  BLOOD LOSS:  300cc DRAINS: 1 hemovac, 1 pain catheter COMPLICATIONS:  None.  PLAN OF CARE: Admit to inpatient   PATIENT DISPOSITION:  PACU - hemodynamically stable.   Delay start of Pharmacological VTE agent  (>24hrs) due to surgical blood loss or risk of bleeding:  not applicable  Please fax a copy of this op note to my office at [phone] (please only include page 1 and 2 of the Case Information op note)

## 2014-11-09 NOTE — Care Management Note (Signed)
CARE MANAGEMENT NOTE 11/09/2014  Patient:  Laura Rocha,Laura Rocha   Account Number:  [id number]  Date Initiated:  11/09/2014  Documentation initiated by:  Vance PeperBRADY,Satomi Buda  Subjective/Objective Assessment:   80 yr old female admitted with osteoarthritis of left knee. Patient had a left total knee arthroplasty.     Action/Plan:   Case manager spoke with patient concerning home health and DME needs. [facility] [phone]. Patient lives with her daughter.   Anticipated DC Date:  11/09/2014   Anticipated DC Plan:  HOME W HOME HEALTH SERVICES      DC Planning Services  CM consult      PAC Choice  DURABLE MEDICAL EQUIPMENT  HOME HEALTH   Choice offered to / List presented to:  C-1 Patient   DME arranged  [facility]  3-N-1  CPM      DME agency  [employer]     HH arranged  HH-2 PT  HH-3 OT      [facility]  [facility]   Status of service:  Completed, signed off Medicare Important Message given?  NA - LOS <3 / Initial given by admissions (If response is "NO", the following Medicare IM given date fields will be blank) Date Medicare IM given:   Medicare IM given by:   Date Additional Medicare IM given:   Additional Medicare IM given by:    Discharge Disposition:  HOME W HOME HEALTH SERVICES  Per UR Regulation:  Reviewed for med. necessity/level of care/duration of stay

## 2014-11-09 NOTE — Discharge Instructions (Signed)
Diet: As you were doing prior to hospitalization   Activity:  Increase activity slowly as tolerated                  No lifting or driving for 6 weeks  Shower:  May shower without a dressing once there is no drainage from your wound.                 Do NOT wash over the wound.                 Dressing:  You may change your dressing on Thursday                    Then change the dressing daily with sterile 4"x4"s gauze dressing                     And TED hose for knees.  Weight Bearing:  Weight bearing as tolerated as taught in physical therapy.  Use a                                walker or Crutches as instructed.  To prevent constipation: you may use a stool softener such as -               Colace ( over the counter) 100 mg by mouth twice a day                Drink plenty of fluids ( prune juice may be helpful) and high fiber foods                Miralax ( over the counter) for constipation as needed.    Precautions:  If you experience chest pain or shortness of breath - call 911 immediately               For transfer to the hospital emergency department!!               If you develop a fever greater that 101 F, purulent drainage from wound,                             increased redness or drainage from wound, or calf pain -- Call the office.  Follow- Up Appointment:  Please call for an appointment to be seen on 11/23/14                                              [facility] office:  [phone]            [address] [place], [place] [address]

## 2014-11-09 NOTE — Progress Notes (Signed)
SPORTS MEDICINE AND JOINT REPLACEMENT  Georgena SpurlingStephen Lucey, MD   Altamese CabalMaurice Marselino Slayton, PA-C [address], [place], [place]  [address]                             [phone]   PROGRESS NOTE  Subjective:  negative for Chest Pain  negative for Shortness of Breath  positive for Nausea/Vomiting   negative for Calf Pain  negative for Bowel Movement   Tolerating Diet: yes         Patient reports pain as 5 on 0-10 scale.    Objective: Vital signs in last 24 hours:   Patient Vitals for the past 24 hrs:  BP Temp Temp src Pulse Resp SpO2  11/09/14 1407 126/62 mmHg 98.5 F (36.9 C) Oral 87 20 92 %  11/09/14 0931 (!) 146/63 mmHg - - 91 - -  11/09/14 0537 (!) 155/70 mmHg 98.8 F (37.1 C) Oral 95 - 94 %  11/09/14 0400 - - - - 18 -  11/09/14 0225 129/69 mmHg 98.1 F (36.7 C) Oral 87 - 98 %  11/09/14 0000 - - - - 16 96 %  11/08/14 2044 (!) 142/66 mmHg 98 F (36.7 C) Oral 85 - 96 %  11/08/14 2000 - - - - 17 -    @flow {1959:LAST@   Intake/Output from previous day:   12/14 0701 - 12/15 0700 In: 2980 [P.O.:120; I.V.:2760] Out: 450 [Urine:200; Drains:250]   Intake/Output this shift:   12/15 0701 - 12/15 1900 In: 240 [P.O.:240] Out: 240    Intake/Output      12/14 0701 - 12/15 0700 12/15 0701 - 12/16 0700   P.O. 120 240   I.V. (mL/kg) 2760 (36.7)    IV Piggyback 100    Total Intake(mL/kg) 2980 (39.6) 240 (3.2)   Urine (mL/kg/hr) 200 (0.1)    Drains 250 (0.1)    Other  240 (0.4)   Total Output 450 240   Net +2530 0        Urine Occurrence 3 x 2 x      LABORATORY DATA:  Recent Labs  11/08/14 1605 11/09/14 0506  WBC 10.4 11.3*  HGB 12.3 11.5*  HCT 36.3 34.9*  PLT 161 168    Recent Labs  11/08/14 1605 11/09/14 0506  NA  --  140  K  --  3.5*  CL  --  100  CO2  --  25  BUN  --  12  CREATININE 0.65 0.70  GLUCOSE  --  113*  CALCIUM  --  9.2   Lab Results  Component Value Date   INR 0.90 10/28/2014    Examination:  General appearance: alert, cooperative and  no distress Extremities: Homans sign is negative, no sign of DVT  Wound Exam: clean, dry, intact   Drainage:  None: wound tissue dry  Motor Exam: EHL and FHL Intact  Sensory Exam: Deep Peroneal normal   Assessment:    1 Day Post-Op  Procedure(s) (LRB): LEFT TOTAL KNEE ARTHROPLASTY (Left)  ADDITIONAL DIAGNOSIS:  Active Problems:   S/P total knee arthroplasty  Acute Blood Loss Anemia   Plan: Physical Therapy as ordered Weight Bearing as Tolerated (WBAT)  DVT Prophylaxis:  Lovenox  DISCHARGE PLAN: Home  DISCHARGE NEEDS: HHPT, CPM, Walker and 3-in-1 comode seat         Khaliyah Northrop 11/09/2014, 3:25 PM

## 2014-11-09 NOTE — Evaluation (Signed)
Physical Therapy Evaluation Patient Details Name: Laura Rocha MRN: [id number] DOB: [birth year] Today's Date: 11/09/2014   History of Present Illness  Pt s/p LTKA  Clinical Impression  Patient presents with decreased independence with mobility due to deficits listed in PT problem list.  She will benefit from skilled PT in the acute setting to allow return home with family assist and HHPT.    Follow Up Recommendations Home health PT    Equipment Recommendations  Rolling walker with 5" wheels    Recommendations for Other Services       Precautions / Restrictions Precautions Precautions: Knee Restrictions LLE Weight Bearing: Weight bearing as tolerated      Mobility  Bed Mobility Overal bed mobility: Needs Assistance Bed Mobility: Supine to Sit;Sit to Supine     Supine to sit: Min assist Sit to supine: Supervision   General bed mobility comments: for left LE  Transfers Overall transfer level: Needs assistance Equipment used: Rolling walker (2 wheeled) Transfers: Sit to/from Stand Sit to Stand: Supervision         General transfer comment: cues for technique  Ambulation/Gait Ambulation/Gait assistance: Min guard Ambulation Distance (Feet): 180 Feet Assistive device: Rolling walker (2 wheeled) Gait Pattern/deviations: Step-through pattern;Antalgic     General Gait Details: decent speed with walker and adjusted her walker in room for her height  Stairs            Wheelchair Mobility    Modified Rankin (Stroke Patients Only)       Balance Overall balance assessment: Needs assistance           Standing balance-Leahy Scale: Fair Standing balance comment: can stand without UE support                             Pertinent Vitals/Pain Pain Score: 3  Pain Location: lt knee Pain Intervention(s): Monitored during session    Home Living Family/patient expects to be discharged to:: Private residence Living Arrangements:  Alone Available Help at Discharge: Family Type of Home: House Home Access: Stairs to enter   Entergy CorporationEntrance Stairs-Number of Steps: 1 Home Layout: Two level;Able to live on main level with bedroom/bathroom Home Equipment: None      Prior Function Level of Independence: Independent               Hand Dominance   Dominant Hand: Right    Extremity/Trunk Assessment   Upper Extremity Assessment: Defer to OT evaluation           Lower Extremity Assessment: LLE deficits/detail   LLE Deficits / Details: AROM ankle WFL, knee flexion AAROM 82, strength grossly 3+/5     Communication   Communication: No difficulties  Cognition Arousal/Alertness: Awake/alert Behavior During Therapy: WFL for tasks assessed/performed Overall Cognitive Status: Within Functional Limits for tasks assessed                      General Comments      Exercises Total Joint Exercises Ankle Circles/Pumps: AROM;20 reps;Both;Supine Quad Sets: AROM;Left;10 reps;Supine Short Arc Quad: AROM;Left;10 reps;Supine Heel Slides: AAROM;Left;10 reps;Supine Hip ABduction/ADduction: AROM;Left;10 reps;Supine Straight Leg Raises: AROM;Left;10 reps;Supine Knee Flexion: AROM;Left;10 reps;Seated Goniometric ROM: left knee flexion 82      Assessment/Plan    PT Assessment Patient needs continued PT services  PT Diagnosis Acute pain;Difficulty walking   PT Problem List Decreased strength;Decreased range of motion;Decreased mobility;Decreased activity tolerance;Decreased balance;Pain;Decreased knowledge of use of DME  PT  Treatment Interventions DME instruction;Gait training;Therapeutic exercise;Balance training;Functional mobility training;Therapeutic activities;Patient/family education;Stair training   PT Goals (Current goals can be found in the Care Plan section) Acute Rehab PT Goals Patient Stated Goal: home tomorrow PT Goal Formulation: With patient Time For Goal Achievement: 11/13/14 Potential to  Achieve Goals: Good    Frequency 7X/week   Barriers to discharge        Co-evaluation               End of Session Equipment Utilized During Treatment: Gait belt Activity Tolerance: Patient tolerated treatment well Patient left: in bed;with call bell/phone within reach           Time: [id number] PT Time Calculation (min) (ACUTE ONLY): 40 min   Charges:   PT Evaluation $Initial PT Evaluation Tier I: 1 Procedure PT Treatments $Gait Training: 8-22 mins $Therapeutic Exercise: 8-22 mins   PT G Codes:          Belkys Henault,CYNDI 11/09/2014, 2:41 PM Sheran Lawlessyndi Lucie Friedlander, PT [phone] 11/09/2014

## 2014-11-09 NOTE — Evaluation (Addendum)
Occupational Therapy Evaluation Patient Details Name: Laura Rocha MRN: [id number] DOB: October 12, 1932 Today's Date: 11/09/2014    History of Present Illness Pt s/p LTKA   Clinical Impression   This 80 yo female admitted and underwent above presents to acute OT with decreased balance, decreased mobility, increased pain all affecting her ability to care for herself at home at an Independent level as she was pta. She will benefit from continued OT acutely to get to a Mod I level in this environment for BADLs and follow up HHOT to address any BADLs that may need to be managed differently at home and IADLs since she usually lives alone and only will have intermittent A/S from family. Feel it would be in the best interest for pt's tub/shower transfers to be addressed in her own environment due to the kind of setup she has explained to me.    Follow Up Recommendations  Home health OT (lives alone)    Equipment Recommendations  3 in 1 bedside comode       Precautions / Restrictions Precautions Precautions: Knee Restrictions Weight Bearing Restrictions: Yes LLE Weight Bearing: Weight bearing as tolerated      Mobility Bed Mobility               General bed mobility comments: Pt sitting on EOB upon arrival  Transfers Overall transfer level: Needs assistance Equipment used: Rolling walker (2 wheeled) Transfers: Sit to/from Stand Sit to Stand: Supervision         General transfer comment: VCs for safe hand placement         ADL Overall ADL's : Needs assistance/impaired Eating/Feeding: Independent;Sitting   Grooming: Oral care;Set up;Standing   Upper Body Bathing: Set up;Sitting   Lower Body Bathing: Set up;Supervison/ safety;Sit to/from stand   Upper Body Dressing : Set up;Sitting   Lower Body Dressing: Supervision/safety;Set up;Sit to/from stand   Toilet Transfer: Supervision/safety;Ambulation;RW;BSC (over toilet)   Toileting- Clothing Manipulation and  Hygiene: Supervision/safety;Sit to/from stand                         Pertinent Vitals/Pain Pain Assessment: 0-10 Pain Score: 2  Pain Location: Left knee Pain Descriptors / Indicators: Aching Pain Intervention(s): Monitored during session;Repositioned     Hand Dominance Right   Extremity/Trunk Assessment Upper Extremity Assessment Upper Extremity Assessment: Overall WFL for tasks assessed   Lower Extremity Assessment Lower Extremity Assessment: Defer to PT evaluation       Communication Communication Communication: No difficulties   Cognition Arousal/Alertness: Awake/alert Behavior During Therapy: WFL for tasks assessed/performed Overall Cognitive Status: Within Functional Limits for tasks assessed                                Home Living Family/patient expects to be discharged to:: Private residence Living Arrangements: Alone Available Help at Discharge: Family;Available PRN/intermittently Type of Home: House       Home Layout: Two level;Able to live on main level with bedroom/bathroom Alternate Level Stairs-Number of Steps: Plans on staying downstairs on couch in the short run   Bathroom Shower/Tub: Tub/shower unit;Curtain; "house built in 1940's, it is a children's corner tub" Shower/tub characteristics: Engineer, building servicesCurtain Bathroom Toilet: Standard     Home Equipment: None          Prior Functioning/Environment Level of Independence: Independent        Comments: Driving    OT Diagnosis: Generalized weakness;Acute pain  OT Problem List: Decreased strength;Decreased range of motion;Impaired balance (sitting and/or standing);Pain;Obesity   OT Treatment/Interventions: Self-care/ADL training;Patient/family education;Balance training;DME and/or AE instruction;Therapeutic activities    OT Goals(Current goals can be found in the care plan section) Acute Rehab OT Goals Patient Stated Goal: home tomorrow OT Goal Formulation: With patient Time  For Goal Achievement: 11/16/14 Potential to Achieve Goals: Good  OT Frequency: Min 2X/week              End of Session Equipment Utilized During Treatment: Gait belt;Rolling walker CPM Left Knee CPM Left Knee: Off Left Knee Flexion (Degrees): 90 Left Knee Extension (Degrees): 0  Activity Tolerance: Patient tolerated treatment well Patient left: in chair;with call bell/phone within reach   Time: 0810-0837 OT Time Calculation (min): 27 min Charges:  OT General Charges $OT Visit: 1 Procedure OT Evaluation $Initial OT Evaluation Tier I: 1 Procedure OT Treatments $Self Care/Home Management : 8-22 mins  Evette GeorgesLeonard, Larsen Dungan Eva [phone] 11/09/2014, 9:04 AM

## 2014-11-09 NOTE — Progress Notes (Signed)
Physical Therapy Treatment Patient Details Name: Laura Rocha MRN: [id number] DOB: [birth year] Today's Date: 11/09/2014    History of Present Illness Pt s/p LTKA    PT Comments    Patient given written instructions and review of HEP.  Also practiced one step for home entry with good understanding.  Patient encouraged to have assist as much as possible due to fall risk after surgery.  Will not have 24 hour assist so discussed safety and keeping phone close at all times.  HHPT to follow at d/c.  Follow Up Recommendations  Home health PT;Supervision - Intermittent     Equipment Recommendations  Rolling walker with 5" wheels    Recommendations for Other Services       Precautions / Restrictions Precautions Precautions: Knee Restrictions LLE Weight Bearing: Weight bearing as tolerated    Mobility  Bed Mobility Overal bed mobility: Needs Assistance Bed Mobility: Supine to Sit;Sit to Supine     Supine to sit: Supervision Sit to supine: Supervision   General bed mobility comments: for left LE  Transfers Overall transfer level: Needs assistance Equipment used: Rolling walker (2 wheeled) Transfers: Sit to/from Stand Sit to Stand: Supervision         General transfer comment: for safety  Ambulation/Gait Ambulation/Gait assistance: Supervision Ambulation Distance (Feet): 200 Feet Assistive device: Rolling walker (2 wheeled) Gait Pattern/deviations: Step-through pattern;Antalgic     General Gait Details: decent speed with walker and adjusted her walker in room for her height   Stairs Stairs: Yes Stairs assistance: Supervision Stair Management: With walker;Backwards;Forwards Number of Stairs: 1 General stair comments: practiced both forwards and back with walker with cues, patient prefers backwards  Wheelchair Mobility    Modified Rankin (Stroke Patients Only)       Balance Overall balance assessment: Needs assistance           Standing  balance-Leahy Scale: Fair Standing balance comment: can stand without UE support                    Cognition Arousal/Alertness: Awake/alert Behavior During Therapy: WFL for tasks assessed/performed Overall Cognitive Status: Within Functional Limits for tasks assessed                      Exercises Total Joint Exercises Ankle Circles/Pumps: AROM;20 reps;Both;Supine Quad Sets: AROM;Left;5 reps;Supine Towel Squeeze: AROM;Right;Supine;Other reps (comment) (2) Short Arc Quad: AROM;Left;5 reps;Supine Heel Slides: AROM;Right;5 reps;Supine Hip ABduction/ADduction: AROM;Left;Other reps (comment);Supine (2) Straight Leg Raises: AROM;Right;Other reps (comment);Supine (2) Knee Flexion: AROM;Left;10 reps;Seated Goniometric ROM: left knee flexion 82    General Comments        Pertinent Vitals/Pain Pain Score: 5  Pain Location: lt knee Pain Intervention(s): Monitored during session;RN gave pain meds during session    Home Living Family/patient expects to be discharged to:: Private residence Living Arrangements: Alone Available Help at Discharge: Family Type of Home: House Home Access: Stairs to enter   Home Layout: Two level;Able to live on main level with bedroom/bathroom Home Equipment: None      Prior Function Level of Independence: Independent          PT Goals (current goals can now be found in the care plan section) Acute Rehab PT Goals Patient Stated Goal: home tomorrow PT Goal Formulation: With patient Time For Goal Achievement: 11/13/14 Potential to Achieve Goals: Good Progress towards PT goals: Progressing toward goals    Frequency  7X/week    PT Plan Current plan remains appropriate  Co-evaluation             End of Session Equipment Utilized During Treatment: Gait belt Activity Tolerance: Patient tolerated treatment well Patient left: in bed;with call bell/phone within reach     Time: 1545-1630 PT Time Calculation (min) (ACUTE  ONLY): 45 min  Charges:  $Gait Training: 8-22 mins $Therapeutic Exercise: 8-22 mins $Self Care/Home Management: 8-22                    G Codes:      Rocha,Laura 11/09/2014, 5:38 PM Sheran Lawlessyndi Rocha, PT [phone] 11/09/2014
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
## 2014-11-30 DIAGNOSIS — M25462 Effusion, left knee: Secondary | ICD-10-CM | POA: Diagnosis not present

## 2014-11-30 DIAGNOSIS — Z96652 Presence of left artificial knee joint: Secondary | ICD-10-CM | POA: Diagnosis not present

## 2014-11-30 DIAGNOSIS — M62552 Muscle wasting and atrophy, not elsewhere classified, left thigh: Secondary | ICD-10-CM | POA: Diagnosis not present

## 2014-11-30 DIAGNOSIS — M79605 Pain in left leg: Secondary | ICD-10-CM | POA: Diagnosis not present

## 2014-12-02 DIAGNOSIS — M79605 Pain in left leg: Secondary | ICD-10-CM | POA: Diagnosis not present

## 2014-12-02 DIAGNOSIS — M25462 Effusion, left knee: Secondary | ICD-10-CM | POA: Diagnosis not present

## 2014-12-02 DIAGNOSIS — M62552 Muscle wasting and atrophy, not elsewhere classified, left thigh: Secondary | ICD-10-CM | POA: Diagnosis not present

## 2014-12-02 DIAGNOSIS — Z96652 Presence of left artificial knee joint: Secondary | ICD-10-CM | POA: Diagnosis not present

## 2014-12-07 DIAGNOSIS — M25462 Effusion, left knee: Secondary | ICD-10-CM | POA: Diagnosis not present

## 2014-12-07 DIAGNOSIS — M79605 Pain in left leg: Secondary | ICD-10-CM | POA: Diagnosis not present

## 2014-12-07 DIAGNOSIS — Z96652 Presence of left artificial knee joint: Secondary | ICD-10-CM | POA: Diagnosis not present

## 2014-12-07 DIAGNOSIS — M62552 Muscle wasting and atrophy, not elsewhere classified, left thigh: Secondary | ICD-10-CM | POA: Diagnosis not present

## 2014-12-09 DIAGNOSIS — M79605 Pain in left leg: Secondary | ICD-10-CM | POA: Diagnosis not present

## 2014-12-09 DIAGNOSIS — M62552 Muscle wasting and atrophy, not elsewhere classified, left thigh: Secondary | ICD-10-CM | POA: Diagnosis not present

## 2014-12-09 DIAGNOSIS — Z96652 Presence of left artificial knee joint: Secondary | ICD-10-CM | POA: Diagnosis not present

## 2014-12-09 DIAGNOSIS — M25462 Effusion, left knee: Secondary | ICD-10-CM | POA: Diagnosis not present

## 2014-12-14 DIAGNOSIS — M25462 Effusion, left knee: Secondary | ICD-10-CM | POA: Diagnosis not present

## 2014-12-14 DIAGNOSIS — M79605 Pain in left leg: Secondary | ICD-10-CM | POA: Diagnosis not present

## 2014-12-14 DIAGNOSIS — Z96652 Presence of left artificial knee joint: Secondary | ICD-10-CM | POA: Diagnosis not present

## 2014-12-14 DIAGNOSIS — M62552 Muscle wasting and atrophy, not elsewhere classified, left thigh: Secondary | ICD-10-CM | POA: Diagnosis not present

## 2014-12-16 DIAGNOSIS — M25462 Effusion, left knee: Secondary | ICD-10-CM | POA: Diagnosis not present

## 2014-12-16 DIAGNOSIS — Z96652 Presence of left artificial knee joint: Secondary | ICD-10-CM | POA: Diagnosis not present

## 2014-12-16 DIAGNOSIS — M62552 Muscle wasting and atrophy, not elsewhere classified, left thigh: Secondary | ICD-10-CM | POA: Diagnosis not present

## 2014-12-16 DIAGNOSIS — M79605 Pain in left leg: Secondary | ICD-10-CM | POA: Diagnosis not present

## 2014-12-21 DIAGNOSIS — M79605 Pain in left leg: Secondary | ICD-10-CM | POA: Diagnosis not present

## 2014-12-21 DIAGNOSIS — M25462 Effusion, left knee: Secondary | ICD-10-CM | POA: Diagnosis not present

## 2014-12-21 DIAGNOSIS — M62552 Muscle wasting and atrophy, not elsewhere classified, left thigh: Secondary | ICD-10-CM | POA: Diagnosis not present

## 2014-12-21 DIAGNOSIS — Z96652 Presence of left artificial knee joint: Secondary | ICD-10-CM | POA: Diagnosis not present

## 2014-12-24 DIAGNOSIS — M79605 Pain in left leg: Secondary | ICD-10-CM | POA: Diagnosis not present

## 2014-12-24 DIAGNOSIS — M25462 Effusion, left knee: Secondary | ICD-10-CM | POA: Diagnosis not present

## 2014-12-24 DIAGNOSIS — Z96652 Presence of left artificial knee joint: Secondary | ICD-10-CM | POA: Diagnosis not present

## 2014-12-24 DIAGNOSIS — M62552 Muscle wasting and atrophy, not elsewhere classified, left thigh: Secondary | ICD-10-CM | POA: Diagnosis not present

## 2014-12-28 DIAGNOSIS — M79605 Pain in left leg: Secondary | ICD-10-CM | POA: Diagnosis not present

## 2014-12-28 DIAGNOSIS — M62552 Muscle wasting and atrophy, not elsewhere classified, left thigh: Secondary | ICD-10-CM | POA: Diagnosis not present

## 2014-12-28 DIAGNOSIS — M25462 Effusion, left knee: Secondary | ICD-10-CM | POA: Diagnosis not present

## 2014-12-28 DIAGNOSIS — Z96652 Presence of left artificial knee joint: Secondary | ICD-10-CM | POA: Diagnosis not present

## 2014-12-30 DIAGNOSIS — M62552 Muscle wasting and atrophy, not elsewhere classified, left thigh: Secondary | ICD-10-CM | POA: Diagnosis not present

## 2014-12-30 DIAGNOSIS — M79605 Pain in left leg: Secondary | ICD-10-CM | POA: Diagnosis not present

## 2014-12-30 DIAGNOSIS — M25462 Effusion, left knee: Secondary | ICD-10-CM | POA: Diagnosis not present

## 2014-12-30 DIAGNOSIS — Z96652 Presence of left artificial knee joint: Secondary | ICD-10-CM | POA: Diagnosis not present

## 2015-01-04 DIAGNOSIS — M25462 Effusion, left knee: Secondary | ICD-10-CM | POA: Diagnosis not present

## 2015-01-04 DIAGNOSIS — M79605 Pain in left leg: Secondary | ICD-10-CM | POA: Diagnosis not present

## 2015-01-04 DIAGNOSIS — M62552 Muscle wasting and atrophy, not elsewhere classified, left thigh: Secondary | ICD-10-CM | POA: Diagnosis not present

## 2015-01-04 DIAGNOSIS — Z96652 Presence of left artificial knee joint: Secondary | ICD-10-CM | POA: Diagnosis not present

## 2015-01-06 DIAGNOSIS — Z96652 Presence of left artificial knee joint: Secondary | ICD-10-CM | POA: Diagnosis not present

## 2015-01-06 DIAGNOSIS — M79605 Pain in left leg: Secondary | ICD-10-CM | POA: Diagnosis not present

## 2015-01-06 DIAGNOSIS — M25462 Effusion, left knee: Secondary | ICD-10-CM | POA: Diagnosis not present

## 2015-01-06 DIAGNOSIS — M62552 Muscle wasting and atrophy, not elsewhere classified, left thigh: Secondary | ICD-10-CM | POA: Diagnosis not present

## 2015-01-11 DIAGNOSIS — M79605 Pain in left leg: Secondary | ICD-10-CM | POA: Diagnosis not present

## 2015-01-11 DIAGNOSIS — M62552 Muscle wasting and atrophy, not elsewhere classified, left thigh: Secondary | ICD-10-CM | POA: Diagnosis not present

## 2015-01-11 DIAGNOSIS — Z96652 Presence of left artificial knee joint: Secondary | ICD-10-CM | POA: Diagnosis not present

## 2015-01-11 DIAGNOSIS — M25462 Effusion, left knee: Secondary | ICD-10-CM | POA: Diagnosis not present

## 2015-01-13 DIAGNOSIS — M79605 Pain in left leg: Secondary | ICD-10-CM | POA: Diagnosis not present

## 2015-01-13 DIAGNOSIS — M25462 Effusion, left knee: Secondary | ICD-10-CM | POA: Diagnosis not present

## 2015-01-13 DIAGNOSIS — M62552 Muscle wasting and atrophy, not elsewhere classified, left thigh: Secondary | ICD-10-CM | POA: Diagnosis not present

## 2015-01-13 DIAGNOSIS — Z96652 Presence of left artificial knee joint: Secondary | ICD-10-CM | POA: Diagnosis not present

## 2015-01-18 DIAGNOSIS — M62552 Muscle wasting and atrophy, not elsewhere classified, left thigh: Secondary | ICD-10-CM | POA: Diagnosis not present

## 2015-01-18 DIAGNOSIS — M25462 Effusion, left knee: Secondary | ICD-10-CM | POA: Diagnosis not present

## 2015-01-18 DIAGNOSIS — M79605 Pain in left leg: Secondary | ICD-10-CM | POA: Diagnosis not present

## 2015-01-18 DIAGNOSIS — Z96652 Presence of left artificial knee joint: Secondary | ICD-10-CM | POA: Diagnosis not present

## 2015-05-05 DIAGNOSIS — Z96652 Presence of left artificial knee joint: Secondary | ICD-10-CM | POA: Diagnosis not present

## 2015-06-09 DIAGNOSIS — E559 Vitamin D deficiency, unspecified: Secondary | ICD-10-CM | POA: Diagnosis not present

## 2015-06-09 DIAGNOSIS — Z1389 Encounter for screening for other disorder: Secondary | ICD-10-CM | POA: Diagnosis not present

## 2015-06-09 DIAGNOSIS — I10 Essential (primary) hypertension: Secondary | ICD-10-CM | POA: Diagnosis not present

## 2015-06-09 DIAGNOSIS — D649 Anemia, unspecified: Secondary | ICD-10-CM | POA: Diagnosis not present

## 2015-06-09 DIAGNOSIS — E785 Hyperlipidemia, unspecified: Secondary | ICD-10-CM | POA: Diagnosis not present

## 2015-06-09 DIAGNOSIS — Z139 Encounter for screening, unspecified: Secondary | ICD-10-CM | POA: Diagnosis not present

## 2015-06-09 DIAGNOSIS — Z0000 Encounter for general adult medical examination without abnormal findings: Secondary | ICD-10-CM | POA: Diagnosis not present

## 2015-06-09 DIAGNOSIS — Z9181 History of falling: Secondary | ICD-10-CM | POA: Diagnosis not present

## 2015-06-09 DIAGNOSIS — R7309 Other abnormal glucose: Secondary | ICD-10-CM | POA: Diagnosis not present

## 2015-07-26 DIAGNOSIS — Z6829 Body mass index (BMI) 29.0-29.9, adult: Secondary | ICD-10-CM | POA: Diagnosis not present

## 2015-07-26 DIAGNOSIS — J208 Acute bronchitis due to other specified organisms: Secondary | ICD-10-CM | POA: Diagnosis not present

## 2015-08-20 IMAGING — CR DG CHEST 2V
2 series · 2 of 2 positions shown · non-contrast
Comparison: Chest x-ray of 02/12/2005 and CT chest of 05/14/2006

CLINICAL DATA: Preop for left knee replacement

EXAM:
CHEST  2 VIEW

[w chest pa]
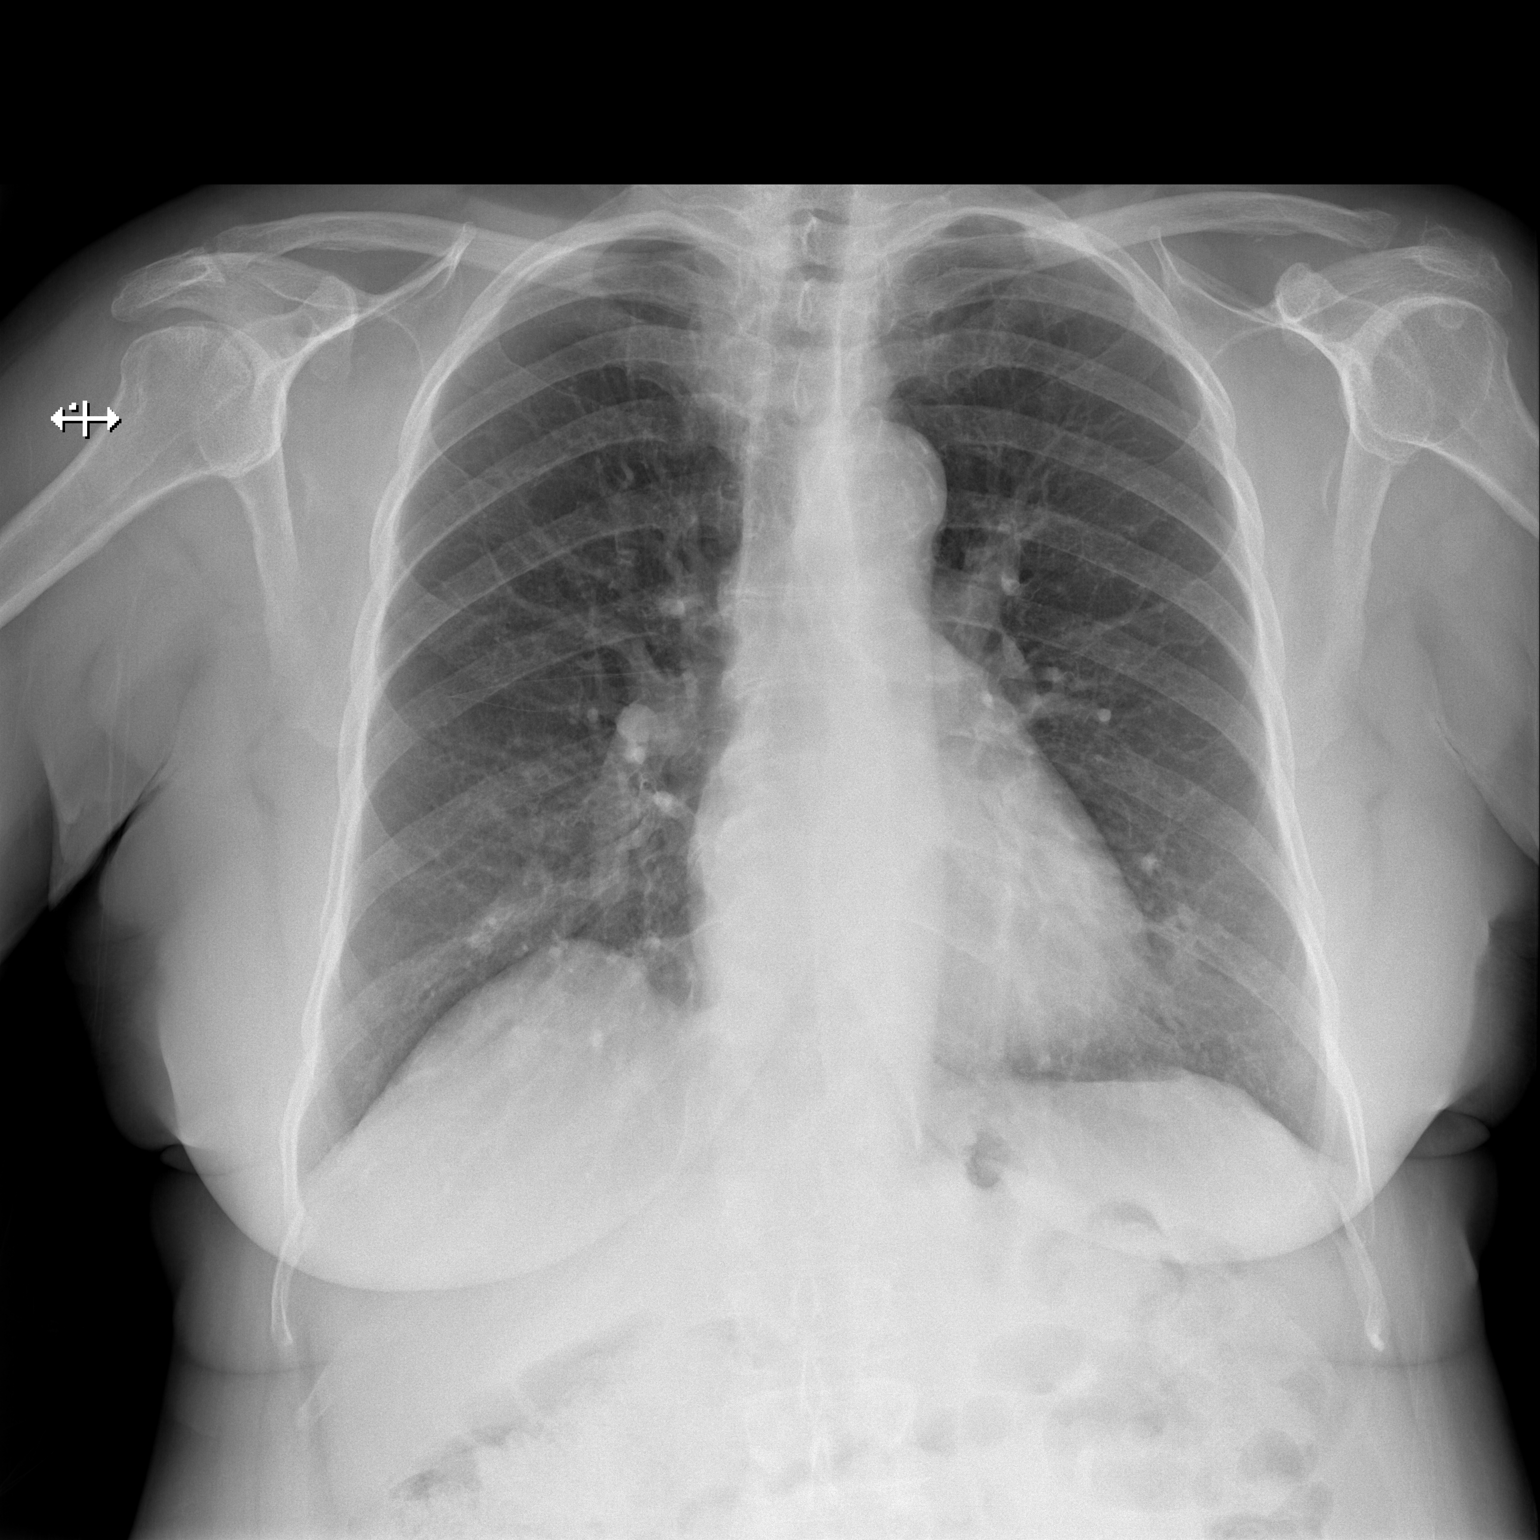

[w chest lat]
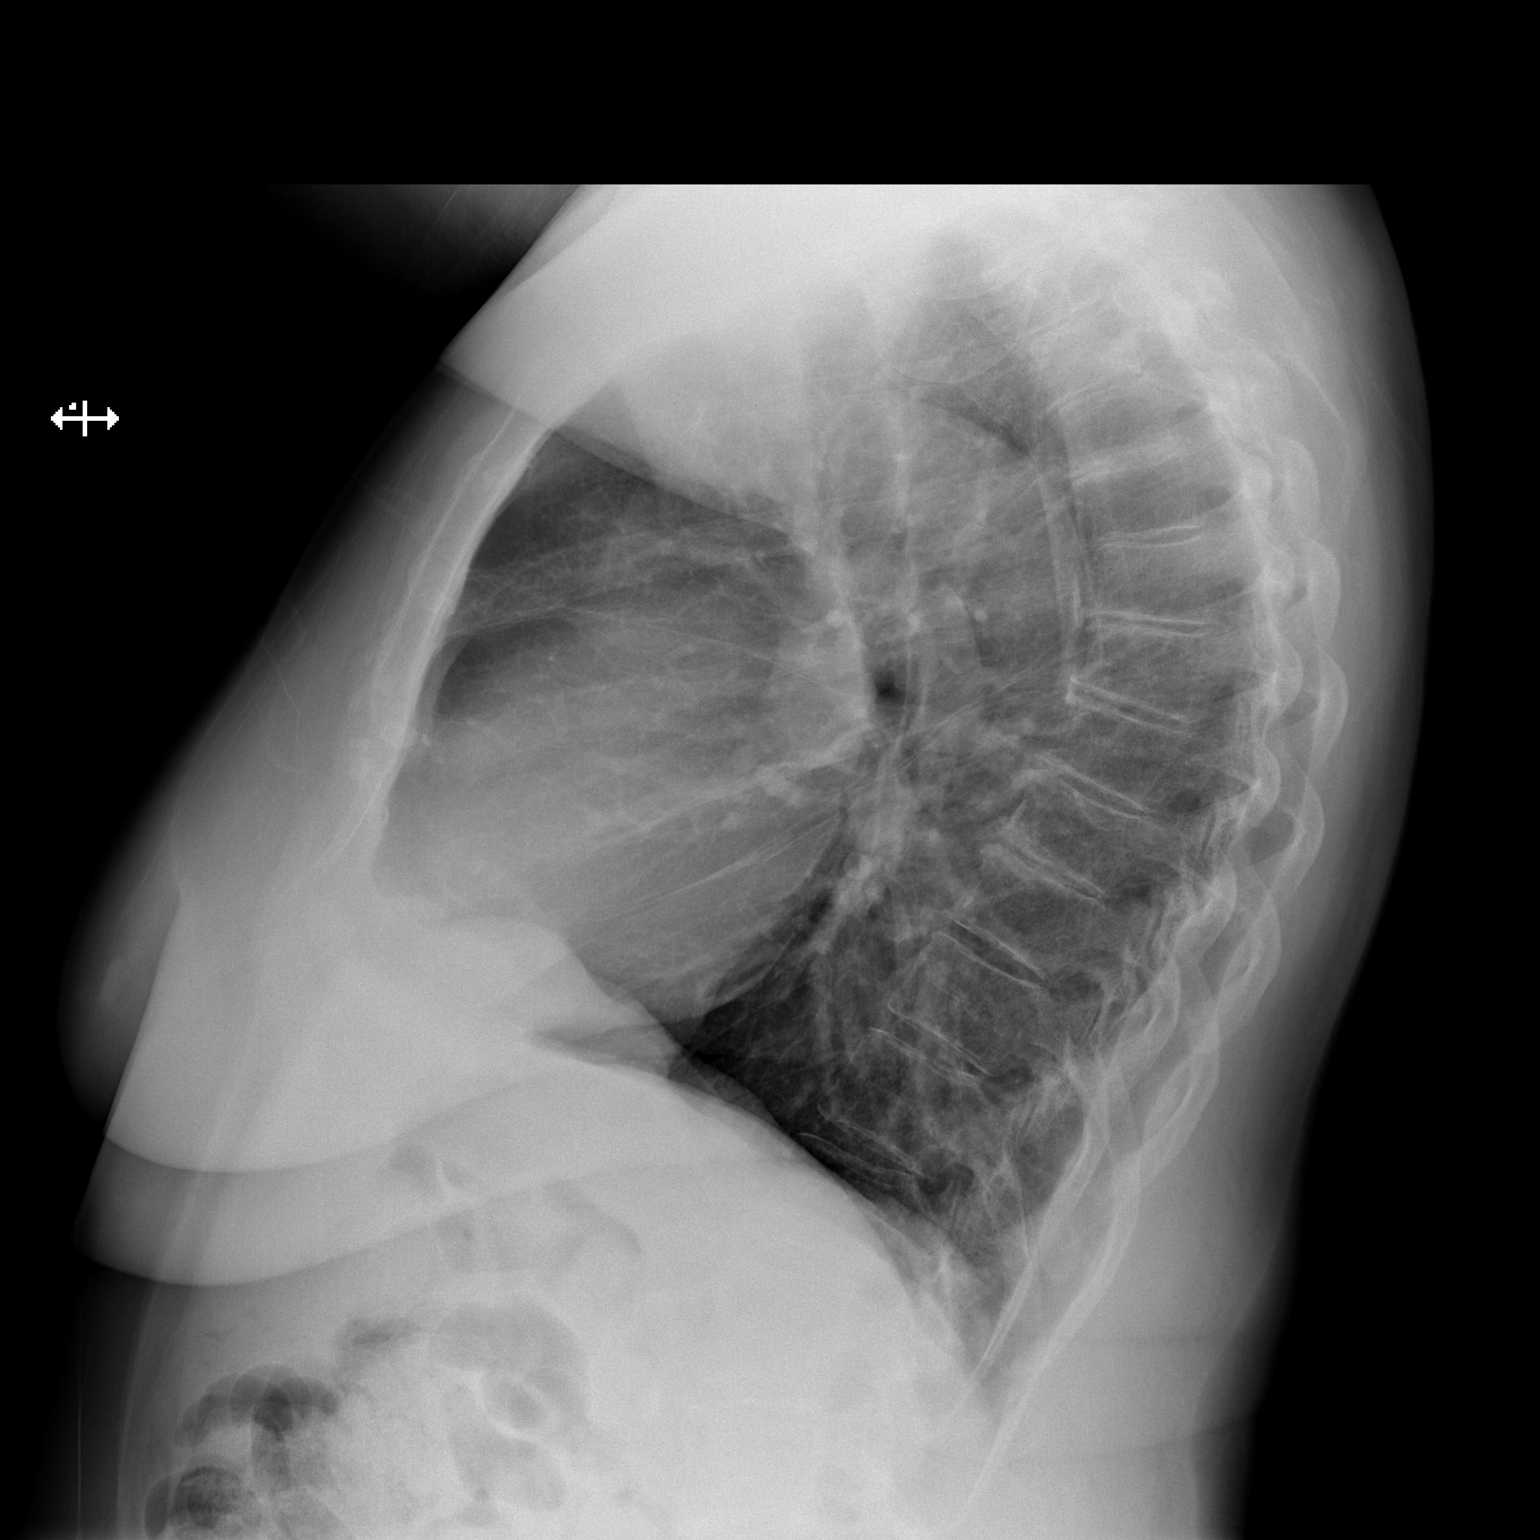

[2 of 2 positions shown; findings below may reference images not displayed]

FINDINGS: No active infiltrate or effusion is seen. Mediastinal and hilar
contours are unremarkable. The heart is mildly enlarged. No acute
bony abnormality is seen.
IMPRESSION: No active lung disease.  Borderline to mild cardiomegaly.

## 2015-09-19 DIAGNOSIS — H52209 Unspecified astigmatism, unspecified eye: Secondary | ICD-10-CM | POA: Diagnosis not present

## 2015-09-19 DIAGNOSIS — H5203 Hypermetropia, bilateral: Secondary | ICD-10-CM | POA: Diagnosis not present

## 2015-09-19 DIAGNOSIS — H524 Presbyopia: Secondary | ICD-10-CM | POA: Diagnosis not present

## 2015-09-19 DIAGNOSIS — Z0100 Encounter for examination of eyes and vision without abnormal findings: Secondary | ICD-10-CM | POA: Diagnosis not present

## 2015-09-19 DIAGNOSIS — H5210 Myopia, unspecified eye: Secondary | ICD-10-CM | POA: Diagnosis not present

## 2015-09-22 DIAGNOSIS — Z96652 Presence of left artificial knee joint: Secondary | ICD-10-CM | POA: Diagnosis not present

## 2015-09-22 DIAGNOSIS — M25562 Pain in left knee: Secondary | ICD-10-CM | POA: Diagnosis not present

## 2015-09-22 DIAGNOSIS — Z9889 Other specified postprocedural states: Secondary | ICD-10-CM | POA: Diagnosis not present

## 2015-09-22 DIAGNOSIS — S86819A Strain of other muscle(s) and tendon(s) at lower leg level, unspecified leg, initial encounter: Secondary | ICD-10-CM | POA: Insufficient documentation

## 2015-09-26 DIAGNOSIS — M62552 Muscle wasting and atrophy, not elsewhere classified, left thigh: Secondary | ICD-10-CM | POA: Diagnosis not present

## 2015-09-26 DIAGNOSIS — M79662 Pain in left lower leg: Secondary | ICD-10-CM | POA: Diagnosis not present

## 2015-09-26 DIAGNOSIS — Z96652 Presence of left artificial knee joint: Secondary | ICD-10-CM | POA: Diagnosis not present

## 2015-09-28 DIAGNOSIS — M62552 Muscle wasting and atrophy, not elsewhere classified, left thigh: Secondary | ICD-10-CM | POA: Diagnosis not present

## 2015-09-28 DIAGNOSIS — M79662 Pain in left lower leg: Secondary | ICD-10-CM | POA: Diagnosis not present

## 2015-09-28 DIAGNOSIS — Z96652 Presence of left artificial knee joint: Secondary | ICD-10-CM | POA: Diagnosis not present

## 2015-10-03 DIAGNOSIS — M62552 Muscle wasting and atrophy, not elsewhere classified, left thigh: Secondary | ICD-10-CM | POA: Diagnosis not present

## 2015-10-03 DIAGNOSIS — M79662 Pain in left lower leg: Secondary | ICD-10-CM | POA: Diagnosis not present

## 2015-10-03 DIAGNOSIS — Z96652 Presence of left artificial knee joint: Secondary | ICD-10-CM | POA: Diagnosis not present

## 2015-10-27 DIAGNOSIS — Z96652 Presence of left artificial knee joint: Secondary | ICD-10-CM | POA: Diagnosis not present

## 2016-01-16 DIAGNOSIS — E559 Vitamin D deficiency, unspecified: Secondary | ICD-10-CM | POA: Diagnosis not present

## 2016-01-16 DIAGNOSIS — R7301 Impaired fasting glucose: Secondary | ICD-10-CM | POA: Diagnosis not present

## 2016-01-16 DIAGNOSIS — I739 Peripheral vascular disease, unspecified: Secondary | ICD-10-CM | POA: Diagnosis not present

## 2016-01-16 DIAGNOSIS — I10 Essential (primary) hypertension: Secondary | ICD-10-CM | POA: Diagnosis not present

## 2016-01-16 DIAGNOSIS — D649 Anemia, unspecified: Secondary | ICD-10-CM | POA: Diagnosis not present

## 2016-01-16 DIAGNOSIS — E785 Hyperlipidemia, unspecified: Secondary | ICD-10-CM | POA: Diagnosis not present

## 2016-01-26 DIAGNOSIS — Z135 Encounter for screening for eye and ear disorders: Secondary | ICD-10-CM | POA: Diagnosis not present

## 2016-01-30 DIAGNOSIS — I10 Essential (primary) hypertension: Secondary | ICD-10-CM | POA: Diagnosis not present

## 2016-01-30 DIAGNOSIS — I739 Peripheral vascular disease, unspecified: Secondary | ICD-10-CM | POA: Diagnosis not present

## 2016-07-18 DIAGNOSIS — Z9181 History of falling: Secondary | ICD-10-CM | POA: Diagnosis not present

## 2016-07-18 DIAGNOSIS — R7301 Impaired fasting glucose: Secondary | ICD-10-CM | POA: Diagnosis not present

## 2016-07-18 DIAGNOSIS — E785 Hyperlipidemia, unspecified: Secondary | ICD-10-CM | POA: Diagnosis not present

## 2016-07-18 DIAGNOSIS — I10 Essential (primary) hypertension: Secondary | ICD-10-CM | POA: Diagnosis not present

## 2016-07-18 DIAGNOSIS — Z6829 Body mass index (BMI) 29.0-29.9, adult: Secondary | ICD-10-CM | POA: Diagnosis not present

## 2016-07-18 DIAGNOSIS — E559 Vitamin D deficiency, unspecified: Secondary | ICD-10-CM | POA: Diagnosis not present

## 2016-07-18 DIAGNOSIS — D649 Anemia, unspecified: Secondary | ICD-10-CM | POA: Diagnosis not present

## 2016-07-18 DIAGNOSIS — M8589 Other specified disorders of bone density and structure, multiple sites: Secondary | ICD-10-CM | POA: Diagnosis not present

## 2016-07-18 DIAGNOSIS — Z1389 Encounter for screening for other disorder: Secondary | ICD-10-CM | POA: Diagnosis not present

## 2017-01-21 DIAGNOSIS — R7301 Impaired fasting glucose: Secondary | ICD-10-CM | POA: Diagnosis not present

## 2017-01-21 DIAGNOSIS — E559 Vitamin D deficiency, unspecified: Secondary | ICD-10-CM | POA: Diagnosis not present

## 2017-01-21 DIAGNOSIS — I10 Essential (primary) hypertension: Secondary | ICD-10-CM | POA: Diagnosis not present

## 2017-01-21 DIAGNOSIS — E785 Hyperlipidemia, unspecified: Secondary | ICD-10-CM | POA: Diagnosis not present

## 2017-01-21 DIAGNOSIS — Z6830 Body mass index (BMI) 30.0-30.9, adult: Secondary | ICD-10-CM | POA: Diagnosis not present

## 2017-01-21 DIAGNOSIS — M8589 Other specified disorders of bone density and structure, multiple sites: Secondary | ICD-10-CM | POA: Diagnosis not present

## 2017-01-21 DIAGNOSIS — D649 Anemia, unspecified: Secondary | ICD-10-CM | POA: Diagnosis not present

## 2017-04-18 DIAGNOSIS — Z6830 Body mass index (BMI) 30.0-30.9, adult: Secondary | ICD-10-CM | POA: Diagnosis not present

## 2017-04-18 DIAGNOSIS — E785 Hyperlipidemia, unspecified: Secondary | ICD-10-CM | POA: Diagnosis not present

## 2017-04-18 DIAGNOSIS — Z0000 Encounter for general adult medical examination without abnormal findings: Secondary | ICD-10-CM | POA: Diagnosis not present

## 2017-04-18 DIAGNOSIS — Z136 Encounter for screening for cardiovascular disorders: Secondary | ICD-10-CM | POA: Diagnosis not present

## 2017-04-18 DIAGNOSIS — Z1231 Encounter for screening mammogram for malignant neoplasm of breast: Secondary | ICD-10-CM | POA: Diagnosis not present

## 2017-04-18 DIAGNOSIS — Z1389 Encounter for screening for other disorder: Secondary | ICD-10-CM | POA: Diagnosis not present

## 2017-04-18 DIAGNOSIS — Z139 Encounter for screening, unspecified: Secondary | ICD-10-CM | POA: Diagnosis not present

## 2017-04-18 DIAGNOSIS — N959 Unspecified menopausal and perimenopausal disorder: Secondary | ICD-10-CM | POA: Diagnosis not present

## 2017-04-18 DIAGNOSIS — Z9181 History of falling: Secondary | ICD-10-CM | POA: Diagnosis not present

## 2017-05-10 DIAGNOSIS — M8589 Other specified disorders of bone density and structure, multiple sites: Secondary | ICD-10-CM | POA: Diagnosis not present

## 2017-05-10 DIAGNOSIS — Z1231 Encounter for screening mammogram for malignant neoplasm of breast: Secondary | ICD-10-CM | POA: Diagnosis not present

## 2017-05-10 DIAGNOSIS — N959 Unspecified menopausal and perimenopausal disorder: Secondary | ICD-10-CM | POA: Diagnosis not present

## 2017-05-23 DIAGNOSIS — M19011 Primary osteoarthritis, right shoulder: Secondary | ICD-10-CM | POA: Diagnosis not present

## 2017-05-23 DIAGNOSIS — G8929 Other chronic pain: Secondary | ICD-10-CM | POA: Insufficient documentation

## 2017-05-23 DIAGNOSIS — M1711 Unilateral primary osteoarthritis, right knee: Secondary | ICD-10-CM | POA: Diagnosis not present

## 2017-05-23 DIAGNOSIS — M25561 Pain in right knee: Secondary | ICD-10-CM | POA: Diagnosis not present

## 2017-05-23 DIAGNOSIS — M21161 Varus deformity, not elsewhere classified, right knee: Secondary | ICD-10-CM | POA: Diagnosis not present

## 2017-07-09 DIAGNOSIS — H524 Presbyopia: Secondary | ICD-10-CM | POA: Diagnosis not present

## 2017-07-09 DIAGNOSIS — H2513 Age-related nuclear cataract, bilateral: Secondary | ICD-10-CM | POA: Diagnosis not present

## 2017-07-22 DIAGNOSIS — E785 Hyperlipidemia, unspecified: Secondary | ICD-10-CM | POA: Diagnosis not present

## 2017-07-22 DIAGNOSIS — E538 Deficiency of other specified B group vitamins: Secondary | ICD-10-CM | POA: Diagnosis not present

## 2017-07-22 DIAGNOSIS — E559 Vitamin D deficiency, unspecified: Secondary | ICD-10-CM | POA: Diagnosis not present

## 2017-07-22 DIAGNOSIS — D649 Anemia, unspecified: Secondary | ICD-10-CM | POA: Diagnosis not present

## 2017-07-22 DIAGNOSIS — I10 Essential (primary) hypertension: Secondary | ICD-10-CM | POA: Diagnosis not present

## 2017-07-22 DIAGNOSIS — R7301 Impaired fasting glucose: Secondary | ICD-10-CM | POA: Diagnosis not present

## 2017-07-22 DIAGNOSIS — M8589 Other specified disorders of bone density and structure, multiple sites: Secondary | ICD-10-CM | POA: Diagnosis not present

## 2017-07-22 DIAGNOSIS — Z139 Encounter for screening, unspecified: Secondary | ICD-10-CM | POA: Diagnosis not present

## 2017-07-22 DIAGNOSIS — R5383 Other fatigue: Secondary | ICD-10-CM | POA: Diagnosis not present

## 2018-01-27 DIAGNOSIS — R7301 Impaired fasting glucose: Secondary | ICD-10-CM | POA: Diagnosis not present

## 2018-01-27 DIAGNOSIS — D649 Anemia, unspecified: Secondary | ICD-10-CM | POA: Diagnosis not present

## 2018-01-27 DIAGNOSIS — E785 Hyperlipidemia, unspecified: Secondary | ICD-10-CM | POA: Diagnosis not present

## 2018-01-27 DIAGNOSIS — I10 Essential (primary) hypertension: Secondary | ICD-10-CM | POA: Diagnosis not present

## 2018-01-27 DIAGNOSIS — M8589 Other specified disorders of bone density and structure, multiple sites: Secondary | ICD-10-CM | POA: Diagnosis not present

## 2018-01-27 DIAGNOSIS — E559 Vitamin D deficiency, unspecified: Secondary | ICD-10-CM | POA: Diagnosis not present

## 2018-01-27 DIAGNOSIS — Z6829 Body mass index (BMI) 29.0-29.9, adult: Secondary | ICD-10-CM | POA: Diagnosis not present

## 2018-01-27 DIAGNOSIS — Z1331 Encounter for screening for depression: Secondary | ICD-10-CM | POA: Diagnosis not present

## 2018-04-23 DIAGNOSIS — Z0000 Encounter for general adult medical examination without abnormal findings: Secondary | ICD-10-CM | POA: Diagnosis not present

## 2018-04-23 DIAGNOSIS — Z1231 Encounter for screening mammogram for malignant neoplasm of breast: Secondary | ICD-10-CM | POA: Diagnosis not present

## 2018-04-23 DIAGNOSIS — Z9181 History of falling: Secondary | ICD-10-CM | POA: Diagnosis not present

## 2018-04-23 DIAGNOSIS — E785 Hyperlipidemia, unspecified: Secondary | ICD-10-CM | POA: Diagnosis not present

## 2018-04-23 DIAGNOSIS — Z136 Encounter for screening for cardiovascular disorders: Secondary | ICD-10-CM | POA: Diagnosis not present

## 2018-04-23 DIAGNOSIS — Z1331 Encounter for screening for depression: Secondary | ICD-10-CM | POA: Diagnosis not present

## 2018-08-06 DIAGNOSIS — Z6829 Body mass index (BMI) 29.0-29.9, adult: Secondary | ICD-10-CM | POA: Diagnosis not present

## 2018-08-06 DIAGNOSIS — M8589 Other specified disorders of bone density and structure, multiple sites: Secondary | ICD-10-CM | POA: Diagnosis not present

## 2018-08-06 DIAGNOSIS — D649 Anemia, unspecified: Secondary | ICD-10-CM | POA: Diagnosis not present

## 2018-08-06 DIAGNOSIS — Z1339 Encounter for screening examination for other mental health and behavioral disorders: Secondary | ICD-10-CM | POA: Diagnosis not present

## 2018-08-06 DIAGNOSIS — Z139 Encounter for screening, unspecified: Secondary | ICD-10-CM | POA: Diagnosis not present

## 2018-08-06 DIAGNOSIS — R7301 Impaired fasting glucose: Secondary | ICD-10-CM | POA: Diagnosis not present

## 2018-08-06 DIAGNOSIS — I10 Essential (primary) hypertension: Secondary | ICD-10-CM | POA: Diagnosis not present

## 2018-08-06 DIAGNOSIS — E559 Vitamin D deficiency, unspecified: Secondary | ICD-10-CM | POA: Diagnosis not present

## 2018-08-06 DIAGNOSIS — E785 Hyperlipidemia, unspecified: Secondary | ICD-10-CM | POA: Diagnosis not present

## 2018-08-11 DIAGNOSIS — Z6829 Body mass index (BMI) 29.0-29.9, adult: Secondary | ICD-10-CM | POA: Diagnosis not present

## 2018-08-11 DIAGNOSIS — I10 Essential (primary) hypertension: Secondary | ICD-10-CM | POA: Diagnosis not present

## 2018-10-21 DIAGNOSIS — M25562 Pain in left knee: Secondary | ICD-10-CM | POA: Diagnosis not present

## 2018-10-21 DIAGNOSIS — R69 Illness, unspecified: Secondary | ICD-10-CM | POA: Diagnosis not present

## 2018-10-21 DIAGNOSIS — Z96652 Presence of left artificial knee joint: Secondary | ICD-10-CM | POA: Diagnosis not present

## 2018-10-21 DIAGNOSIS — Z471 Aftercare following joint replacement surgery: Secondary | ICD-10-CM | POA: Diagnosis not present

## 2019-02-04 DIAGNOSIS — E785 Hyperlipidemia, unspecified: Secondary | ICD-10-CM | POA: Diagnosis not present

## 2019-02-04 DIAGNOSIS — E559 Vitamin D deficiency, unspecified: Secondary | ICD-10-CM | POA: Diagnosis not present

## 2019-02-04 DIAGNOSIS — I10 Essential (primary) hypertension: Secondary | ICD-10-CM | POA: Diagnosis not present

## 2019-02-04 DIAGNOSIS — R7301 Impaired fasting glucose: Secondary | ICD-10-CM | POA: Diagnosis not present

## 2019-02-04 DIAGNOSIS — Z6830 Body mass index (BMI) 30.0-30.9, adult: Secondary | ICD-10-CM | POA: Diagnosis not present

## 2019-02-04 DIAGNOSIS — D649 Anemia, unspecified: Secondary | ICD-10-CM | POA: Diagnosis not present

## 2019-02-04 DIAGNOSIS — M8589 Other specified disorders of bone density and structure, multiple sites: Secondary | ICD-10-CM | POA: Diagnosis not present

## 2019-07-20 DIAGNOSIS — M79605 Pain in left leg: Secondary | ICD-10-CM | POA: Diagnosis not present

## 2019-07-21 DIAGNOSIS — M79662 Pain in left lower leg: Secondary | ICD-10-CM | POA: Diagnosis not present

## 2019-07-21 DIAGNOSIS — M79605 Pain in left leg: Secondary | ICD-10-CM | POA: Diagnosis not present

## 2019-08-12 DIAGNOSIS — R7301 Impaired fasting glucose: Secondary | ICD-10-CM | POA: Diagnosis not present

## 2019-08-12 DIAGNOSIS — I10 Essential (primary) hypertension: Secondary | ICD-10-CM | POA: Diagnosis not present

## 2019-08-12 DIAGNOSIS — E785 Hyperlipidemia, unspecified: Secondary | ICD-10-CM | POA: Diagnosis not present

## 2019-08-12 DIAGNOSIS — Z139 Encounter for screening, unspecified: Secondary | ICD-10-CM | POA: Diagnosis not present

## 2019-08-12 DIAGNOSIS — Z9181 History of falling: Secondary | ICD-10-CM | POA: Diagnosis not present

## 2019-08-12 DIAGNOSIS — D649 Anemia, unspecified: Secondary | ICD-10-CM | POA: Diagnosis not present

## 2019-08-12 DIAGNOSIS — M8589 Other specified disorders of bone density and structure, multiple sites: Secondary | ICD-10-CM | POA: Diagnosis not present

## 2019-08-12 DIAGNOSIS — Z1331 Encounter for screening for depression: Secondary | ICD-10-CM | POA: Diagnosis not present

## 2019-08-12 DIAGNOSIS — E559 Vitamin D deficiency, unspecified: Secondary | ICD-10-CM | POA: Diagnosis not present

## 2019-08-12 DIAGNOSIS — R5383 Other fatigue: Secondary | ICD-10-CM | POA: Diagnosis not present

## 2019-11-10 DIAGNOSIS — E785 Hyperlipidemia, unspecified: Secondary | ICD-10-CM | POA: Diagnosis not present

## 2019-11-10 DIAGNOSIS — D649 Anemia, unspecified: Secondary | ICD-10-CM | POA: Diagnosis not present

## 2019-11-10 DIAGNOSIS — M8589 Other specified disorders of bone density and structure, multiple sites: Secondary | ICD-10-CM | POA: Diagnosis not present

## 2019-11-10 DIAGNOSIS — E559 Vitamin D deficiency, unspecified: Secondary | ICD-10-CM | POA: Diagnosis not present

## 2019-11-10 DIAGNOSIS — I10 Essential (primary) hypertension: Secondary | ICD-10-CM | POA: Diagnosis not present

## 2019-11-10 DIAGNOSIS — R7301 Impaired fasting glucose: Secondary | ICD-10-CM | POA: Diagnosis not present

## 2020-01-08 DIAGNOSIS — M85851 Other specified disorders of bone density and structure, right thigh: Secondary | ICD-10-CM | POA: Diagnosis not present

## 2020-01-08 DIAGNOSIS — M8589 Other specified disorders of bone density and structure, multiple sites: Secondary | ICD-10-CM | POA: Diagnosis not present

## 2020-02-09 DIAGNOSIS — E785 Hyperlipidemia, unspecified: Secondary | ICD-10-CM | POA: Diagnosis not present

## 2020-05-10 DIAGNOSIS — E559 Vitamin D deficiency, unspecified: Secondary | ICD-10-CM | POA: Diagnosis not present

## 2020-05-10 DIAGNOSIS — M8589 Other specified disorders of bone density and structure, multiple sites: Secondary | ICD-10-CM | POA: Diagnosis not present

## 2020-05-10 DIAGNOSIS — E538 Deficiency of other specified B group vitamins: Secondary | ICD-10-CM | POA: Diagnosis not present

## 2020-05-10 DIAGNOSIS — R5382 Chronic fatigue, unspecified: Secondary | ICD-10-CM | POA: Diagnosis not present

## 2020-05-10 DIAGNOSIS — R5381 Other malaise: Secondary | ICD-10-CM | POA: Diagnosis not present

## 2020-05-10 DIAGNOSIS — I739 Peripheral vascular disease, unspecified: Secondary | ICD-10-CM | POA: Diagnosis not present

## 2020-05-10 DIAGNOSIS — D649 Anemia, unspecified: Secondary | ICD-10-CM | POA: Diagnosis not present

## 2020-05-10 DIAGNOSIS — E785 Hyperlipidemia, unspecified: Secondary | ICD-10-CM | POA: Diagnosis not present

## 2020-05-10 DIAGNOSIS — I10 Essential (primary) hypertension: Secondary | ICD-10-CM | POA: Diagnosis not present

## 2020-05-10 DIAGNOSIS — R7301 Impaired fasting glucose: Secondary | ICD-10-CM | POA: Diagnosis not present

## 2020-05-17 DIAGNOSIS — I739 Peripheral vascular disease, unspecified: Secondary | ICD-10-CM | POA: Diagnosis not present

## 2020-07-27 ENCOUNTER — Encounter: Payer: Medicare HMO | Admitting: Vascular Surgery

## 2020-07-27 ENCOUNTER — Encounter (HOSPITAL_COMMUNITY): Payer: Medicare HMO

## 2020-07-29 ENCOUNTER — Other Ambulatory Visit: Payer: Self-pay

## 2020-07-29 DIAGNOSIS — G609 Hereditary and idiopathic neuropathy, unspecified: Secondary | ICD-10-CM

## 2020-08-10 DIAGNOSIS — E559 Vitamin D deficiency, unspecified: Secondary | ICD-10-CM | POA: Diagnosis not present

## 2020-08-10 DIAGNOSIS — R7301 Impaired fasting glucose: Secondary | ICD-10-CM | POA: Diagnosis not present

## 2020-08-10 DIAGNOSIS — M8589 Other specified disorders of bone density and structure, multiple sites: Secondary | ICD-10-CM | POA: Diagnosis not present

## 2020-08-10 DIAGNOSIS — I10 Essential (primary) hypertension: Secondary | ICD-10-CM | POA: Diagnosis not present

## 2020-08-10 DIAGNOSIS — M1711 Unilateral primary osteoarthritis, right knee: Secondary | ICD-10-CM | POA: Diagnosis not present

## 2020-08-10 DIAGNOSIS — E785 Hyperlipidemia, unspecified: Secondary | ICD-10-CM | POA: Diagnosis not present

## 2020-08-10 DIAGNOSIS — D649 Anemia, unspecified: Secondary | ICD-10-CM | POA: Diagnosis not present

## 2020-08-12 ENCOUNTER — Ambulatory Visit (HOSPITAL_COMMUNITY)
Admission: RE | Admit: 2020-08-12 | Discharge: 2020-08-12 | Disposition: A | Discharge status: Home / Self Care | Payer: Medicare HMO | Source: Ambulatory Visit | Attending: Vascular Surgery | Admitting: Vascular Surgery

## 2020-08-12 ENCOUNTER — Ambulatory Visit (INDEPENDENT_AMBULATORY_CARE_PROVIDER_SITE_OTHER): Payer: Medicare HMO | Admitting: Vascular Surgery

## 2020-08-12 ENCOUNTER — Encounter: Payer: Self-pay | Admitting: Vascular Surgery

## 2020-08-12 ENCOUNTER — Other Ambulatory Visit: Payer: Self-pay

## 2020-08-12 VITALS — BP 170/80 | HR 79 | Temp 97.7°F | Resp 20 | Ht 61.5 in | Wt 151.8 lb

## 2020-08-12 DIAGNOSIS — M25562 Pain in left knee: Secondary | ICD-10-CM

## 2020-08-12 DIAGNOSIS — G609 Hereditary and idiopathic neuropathy, unspecified: Secondary | ICD-10-CM | POA: Insufficient documentation

## 2020-08-12 DIAGNOSIS — M25561 Pain in right knee: Secondary | ICD-10-CM | POA: Diagnosis not present
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
## 2020-09-08 DIAGNOSIS — Z0000 Encounter for general adult medical examination without abnormal findings: Secondary | ICD-10-CM | POA: Diagnosis not present

## 2020-09-08 DIAGNOSIS — E785 Hyperlipidemia, unspecified: Secondary | ICD-10-CM | POA: Diagnosis not present

## 2020-09-08 DIAGNOSIS — Z1331 Encounter for screening for depression: Secondary | ICD-10-CM | POA: Diagnosis not present

## 2020-09-08 DIAGNOSIS — Z9181 History of falling: Secondary | ICD-10-CM | POA: Diagnosis not present

## 2020-09-13 DIAGNOSIS — H25813 Combined forms of age-related cataract, bilateral: Secondary | ICD-10-CM | POA: Diagnosis not present

## 2020-09-13 DIAGNOSIS — Z0100 Encounter for examination of eyes and vision without abnormal findings: Secondary | ICD-10-CM | POA: Diagnosis not present

## 2020-10-11 DIAGNOSIS — M25562 Pain in left knee: Secondary | ICD-10-CM | POA: Diagnosis not present

## 2020-10-11 DIAGNOSIS — G8929 Other chronic pain: Secondary | ICD-10-CM | POA: Diagnosis not present

## 2020-10-11 DIAGNOSIS — M1711 Unilateral primary osteoarthritis, right knee: Secondary | ICD-10-CM | POA: Diagnosis not present

## 2020-10-11 DIAGNOSIS — Z96652 Presence of left artificial knee joint: Secondary | ICD-10-CM | POA: Diagnosis not present

## 2021-02-07 DIAGNOSIS — I10 Essential (primary) hypertension: Secondary | ICD-10-CM | POA: Diagnosis not present

## 2021-02-07 DIAGNOSIS — D649 Anemia, unspecified: Secondary | ICD-10-CM | POA: Diagnosis not present

## 2021-02-07 DIAGNOSIS — R7301 Impaired fasting glucose: Secondary | ICD-10-CM | POA: Diagnosis not present

## 2021-02-07 DIAGNOSIS — E785 Hyperlipidemia, unspecified: Secondary | ICD-10-CM | POA: Diagnosis not present

## 2021-02-07 DIAGNOSIS — M8589 Other specified disorders of bone density and structure, multiple sites: Secondary | ICD-10-CM | POA: Diagnosis not present

## 2021-02-07 DIAGNOSIS — I491 Atrial premature depolarization: Secondary | ICD-10-CM | POA: Diagnosis not present

## 2021-02-07 DIAGNOSIS — E559 Vitamin D deficiency, unspecified: Secondary | ICD-10-CM | POA: Diagnosis not present

## 2021-05-10 DIAGNOSIS — Z6827 Body mass index (BMI) 27.0-27.9, adult: Secondary | ICD-10-CM | POA: Diagnosis not present

## 2021-05-10 DIAGNOSIS — I739 Peripheral vascular disease, unspecified: Secondary | ICD-10-CM | POA: Diagnosis not present

## 2021-05-10 DIAGNOSIS — I10 Essential (primary) hypertension: Secondary | ICD-10-CM | POA: Diagnosis not present

## 2021-05-10 DIAGNOSIS — I491 Atrial premature depolarization: Secondary | ICD-10-CM | POA: Diagnosis not present

## 2021-05-10 DIAGNOSIS — E785 Hyperlipidemia, unspecified: Secondary | ICD-10-CM | POA: Diagnosis not present

## 2021-07-20 ENCOUNTER — Other Ambulatory Visit: Payer: Self-pay

## 2021-07-20 DIAGNOSIS — Z96652 Presence of left artificial knee joint: Secondary | ICD-10-CM | POA: Diagnosis not present

## 2021-07-20 DIAGNOSIS — R42 Dizziness and giddiness: Secondary | ICD-10-CM | POA: Diagnosis not present

## 2021-07-20 DIAGNOSIS — R009 Unspecified abnormalities of heart beat: Secondary | ICD-10-CM | POA: Diagnosis not present

## 2021-07-20 DIAGNOSIS — S060X9A Concussion with loss of consciousness of unspecified duration, initial encounter: Secondary | ICD-10-CM | POA: Insufficient documentation

## 2021-07-20 DIAGNOSIS — E7800 Pure hypercholesterolemia, unspecified: Secondary | ICD-10-CM | POA: Insufficient documentation

## 2021-07-20 DIAGNOSIS — E785 Hyperlipidemia, unspecified: Secondary | ICD-10-CM | POA: Diagnosis not present

## 2021-07-20 DIAGNOSIS — M25562 Pain in left knee: Secondary | ICD-10-CM | POA: Diagnosis not present

## 2021-07-20 DIAGNOSIS — I499 Cardiac arrhythmia, unspecified: Secondary | ICD-10-CM | POA: Diagnosis not present

## 2021-07-20 DIAGNOSIS — S060XAA Concussion with loss of consciousness status unknown, initial encounter: Secondary | ICD-10-CM | POA: Insufficient documentation

## 2021-07-20 DIAGNOSIS — K219 Gastro-esophageal reflux disease without esophagitis: Secondary | ICD-10-CM | POA: Diagnosis not present

## 2021-07-20 DIAGNOSIS — I10 Essential (primary) hypertension: Secondary | ICD-10-CM | POA: Diagnosis not present

## 2021-07-20 DIAGNOSIS — M25561 Pain in right knee: Secondary | ICD-10-CM | POA: Diagnosis not present

## 2021-07-20 DIAGNOSIS — R351 Nocturia: Secondary | ICD-10-CM | POA: Insufficient documentation

## 2021-07-20 DIAGNOSIS — Z973 Presence of spectacles and contact lenses: Secondary | ICD-10-CM | POA: Insufficient documentation

## 2021-07-20 DIAGNOSIS — M1990 Unspecified osteoarthritis, unspecified site: Secondary | ICD-10-CM | POA: Diagnosis not present

## 2021-07-20 DIAGNOSIS — Z972 Presence of dental prosthetic device (complete) (partial): Secondary | ICD-10-CM | POA: Insufficient documentation

## 2021-07-24 ENCOUNTER — Ambulatory Visit: Payer: Medicare HMO | Admitting: Cardiology

## 2021-08-10 DIAGNOSIS — R531 Weakness: Secondary | ICD-10-CM | POA: Diagnosis not present

## 2021-08-10 DIAGNOSIS — I10 Essential (primary) hypertension: Secondary | ICD-10-CM | POA: Diagnosis not present

## 2021-08-10 DIAGNOSIS — D649 Anemia, unspecified: Secondary | ICD-10-CM | POA: Diagnosis not present

## 2021-08-10 DIAGNOSIS — R7301 Impaired fasting glucose: Secondary | ICD-10-CM | POA: Diagnosis not present

## 2021-08-10 DIAGNOSIS — E559 Vitamin D deficiency, unspecified: Secondary | ICD-10-CM | POA: Diagnosis not present

## 2021-08-10 DIAGNOSIS — M8589 Other specified disorders of bone density and structure, multiple sites: Secondary | ICD-10-CM | POA: Diagnosis not present

## 2021-08-10 DIAGNOSIS — I491 Atrial premature depolarization: Secondary | ICD-10-CM | POA: Diagnosis not present

## 2021-08-10 DIAGNOSIS — E785 Hyperlipidemia, unspecified: Secondary | ICD-10-CM | POA: Diagnosis not present

## 2021-08-14 ENCOUNTER — Ambulatory Visit: Payer: Medicare HMO | Admitting: Cardiology

## 2021-08-16 DIAGNOSIS — I491 Atrial premature depolarization: Secondary | ICD-10-CM | POA: Insufficient documentation

## 2021-08-16 DIAGNOSIS — Z7982 Long term (current) use of aspirin: Secondary | ICD-10-CM

## 2021-08-16 HISTORY — DX: Long term (current) use of aspirin: Z79.82

## 2021-08-16 HISTORY — DX: Atrial premature depolarization: I49.1

## 2021-08-29 DIAGNOSIS — R0609 Other forms of dyspnea: Secondary | ICD-10-CM | POA: Diagnosis not present

## 2021-08-29 DIAGNOSIS — Z01810 Encounter for preprocedural cardiovascular examination: Secondary | ICD-10-CM | POA: Diagnosis not present

## 2021-08-29 DIAGNOSIS — E782 Mixed hyperlipidemia: Secondary | ICD-10-CM | POA: Diagnosis not present

## 2021-08-29 DIAGNOSIS — I10 Essential (primary) hypertension: Secondary | ICD-10-CM | POA: Diagnosis not present

## 2021-08-29 DIAGNOSIS — Z7982 Long term (current) use of aspirin: Secondary | ICD-10-CM | POA: Diagnosis not present

## 2021-08-29 DIAGNOSIS — I491 Atrial premature depolarization: Secondary | ICD-10-CM | POA: Diagnosis not present

## 2021-09-19 DIAGNOSIS — R002 Palpitations: Secondary | ICD-10-CM | POA: Diagnosis not present

## 2021-09-19 DIAGNOSIS — I517 Cardiomegaly: Secondary | ICD-10-CM | POA: Diagnosis not present

## 2021-11-29 DIAGNOSIS — J208 Acute bronchitis due to other specified organisms: Secondary | ICD-10-CM | POA: Diagnosis not present

## 2021-11-29 DIAGNOSIS — B9689 Other specified bacterial agents as the cause of diseases classified elsewhere: Secondary | ICD-10-CM | POA: Diagnosis not present

## 2022-02-07 DIAGNOSIS — E785 Hyperlipidemia, unspecified: Secondary | ICD-10-CM | POA: Diagnosis not present

## 2022-02-07 DIAGNOSIS — M8589 Other specified disorders of bone density and structure, multiple sites: Secondary | ICD-10-CM | POA: Diagnosis not present

## 2022-02-07 DIAGNOSIS — Z139 Encounter for screening, unspecified: Secondary | ICD-10-CM | POA: Diagnosis not present

## 2022-02-07 DIAGNOSIS — I491 Atrial premature depolarization: Secondary | ICD-10-CM | POA: Diagnosis not present

## 2022-02-07 DIAGNOSIS — E559 Vitamin D deficiency, unspecified: Secondary | ICD-10-CM | POA: Diagnosis not present

## 2022-02-07 DIAGNOSIS — R7301 Impaired fasting glucose: Secondary | ICD-10-CM | POA: Diagnosis not present

## 2022-02-07 DIAGNOSIS — D649 Anemia, unspecified: Secondary | ICD-10-CM | POA: Diagnosis not present

## 2022-02-07 DIAGNOSIS — I10 Essential (primary) hypertension: Secondary | ICD-10-CM | POA: Diagnosis not present

## 2022-08-13 DIAGNOSIS — E559 Vitamin D deficiency, unspecified: Secondary | ICD-10-CM | POA: Diagnosis not present

## 2022-08-13 DIAGNOSIS — I491 Atrial premature depolarization: Secondary | ICD-10-CM | POA: Diagnosis not present

## 2022-08-13 DIAGNOSIS — M1711 Unilateral primary osteoarthritis, right knee: Secondary | ICD-10-CM | POA: Diagnosis not present

## 2022-08-13 DIAGNOSIS — E785 Hyperlipidemia, unspecified: Secondary | ICD-10-CM | POA: Diagnosis not present

## 2022-08-13 DIAGNOSIS — D649 Anemia, unspecified: Secondary | ICD-10-CM | POA: Diagnosis not present

## 2022-08-13 DIAGNOSIS — R7301 Impaired fasting glucose: Secondary | ICD-10-CM | POA: Diagnosis not present

## 2022-08-13 DIAGNOSIS — M8589 Other specified disorders of bone density and structure, multiple sites: Secondary | ICD-10-CM | POA: Diagnosis not present

## 2022-08-13 DIAGNOSIS — I10 Essential (primary) hypertension: Secondary | ICD-10-CM | POA: Diagnosis not present

## 2022-10-05 DIAGNOSIS — Z96652 Presence of left artificial knee joint: Secondary | ICD-10-CM | POA: Diagnosis not present

## 2022-10-05 DIAGNOSIS — M25561 Pain in right knee: Secondary | ICD-10-CM | POA: Diagnosis not present

## 2022-10-05 DIAGNOSIS — M1711 Unilateral primary osteoarthritis, right knee: Secondary | ICD-10-CM | POA: Diagnosis not present

## 2022-11-01 DIAGNOSIS — M1711 Unilateral primary osteoarthritis, right knee: Secondary | ICD-10-CM | POA: Diagnosis not present

## 2022-11-01 DIAGNOSIS — Z01818 Encounter for other preprocedural examination: Secondary | ICD-10-CM | POA: Diagnosis not present

## 2022-11-01 DIAGNOSIS — I10 Essential (primary) hypertension: Secondary | ICD-10-CM | POA: Diagnosis not present

## 2022-11-01 DIAGNOSIS — Z96652 Presence of left artificial knee joint: Secondary | ICD-10-CM | POA: Diagnosis not present

## 2022-11-01 DIAGNOSIS — D649 Anemia, unspecified: Secondary | ICD-10-CM | POA: Diagnosis not present

## 2022-11-01 DIAGNOSIS — Z01810 Encounter for preprocedural cardiovascular examination: Secondary | ICD-10-CM | POA: Diagnosis not present

## 2022-11-01 DIAGNOSIS — R7301 Impaired fasting glucose: Secondary | ICD-10-CM | POA: Diagnosis not present

## 2022-11-01 DIAGNOSIS — M25561 Pain in right knee: Secondary | ICD-10-CM | POA: Diagnosis not present

## 2022-11-16 ENCOUNTER — Encounter: Payer: Self-pay | Admitting: *Deleted

## 2022-11-16 ENCOUNTER — Encounter: Payer: Self-pay | Admitting: Cardiology

## 2022-12-03 ENCOUNTER — Encounter: Payer: Self-pay | Admitting: Cardiology

## 2022-12-03 ENCOUNTER — Ambulatory Visit: Payer: Medicare HMO | Attending: Cardiology | Admitting: Cardiology

## 2022-12-03 VITALS — BP 146/80 | HR 62 | Ht 62.0 in | Wt 146.0 lb

## 2022-12-03 DIAGNOSIS — I10 Essential (primary) hypertension: Secondary | ICD-10-CM

## 2022-12-03 DIAGNOSIS — Z01810 Encounter for preprocedural cardiovascular examination: Secondary | ICD-10-CM | POA: Insufficient documentation

## 2022-12-03 DIAGNOSIS — E7800 Pure hypercholesterolemia, unspecified: Secondary | ICD-10-CM | POA: Diagnosis not present

## 2022-12-03 DIAGNOSIS — R011 Cardiac murmur, unspecified: Secondary | ICD-10-CM | POA: Diagnosis not present

## 2022-12-03 DIAGNOSIS — Z96652 Presence of left artificial knee joint: Secondary | ICD-10-CM

## 2022-12-03 NOTE — Addendum Note (Signed)
Addended by: Jacobo Forest D on: 12/03/2022 03:25 PM   Modules accepted: Orders

## 2022-12-03 NOTE — Progress Notes (Addendum)
PCP - Nelda Bucks , MD Cardiologist - Jenne Campus, MD LOV 12-03-22  for preop eval   PPM/ICD -  Device Orders -  Rep Notified -   Chest x-ray -  EKG - 12-03-22 Stress Test -  ECHO - Scheduled for 12-04-22 Cardiac Cath -   Sleep Study -  CPAP -   Fasting Blood Sugar -  Checks Blood Sugar _____ times a day  Blood Thinner Instructions: Aspirin Instructions:  ERAS Protcol - PRE-SURGERY Ensure or G2-   COVID TEST-  COVID vaccine -  Activity-- Anesthesia review: Please see cardiology preop eval from 12-03-22  Patient denies shortness of breath, fever, cough and chest pain at PAT appointment   All instructions explained to the patient, with a verbal understanding of the material. Patient agrees to go over the instructions while at home for a better understanding. Patient also instructed to self quarantine after being tested for COVID-19. The opportunity to ask questions was provided.
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
## 2022-12-03 NOTE — Patient Instructions (Addendum)
SURGICAL WAITING ROOM VISITATION  Patients having surgery or a procedure may have no more than 2 support people in the waiting area - these visitors may rotate.    Children under the age of 59 must have an adult with them who is not the patient.  Due to an increase in RSV and influenza rates and associated hospitalizations, children ages 21 and under may not visit patients in [facility] hospitals.  If the patient needs to stay at the hospital during part of their recovery, the visitor guidelines for inpatient rooms apply. Pre-op nurse will coordinate an appropriate time for 1 support person to accompany patient in pre-op.  This support person may not rotate.    Please refer to the [facility] website for the visitor guidelines for Inpatients (after your surgery is over and you are in a regular room).       Your procedure is scheduled on: 12-18-22   Report to [facility] Main Entrance    Report to admitting at      0515  AM   Call this number if you have problems the morning of surgery [phone]   Do not eat food :After Midnight.   After Midnight you may have the following liquids until __0415____ AM/ DAY OF SURGERY Then nothing by mouth  Water Non-Citrus Juices (without pulp, NO RED-Apple, White grape, White cranberry) Black Coffee (NO MILK/CREAM OR CREAMERS, sugar ok)  Clear Tea (NO MILK/CREAM OR CREAMERS, sugar ok) regular and decaf                             Plain Jell-O (NO RED)                                           Fruit ices (not with fruit pulp, NO RED)                                     Popsicles (NO RED)                                                               Sports drinks like Gatorade (NO RED)                  The day of surgery:  Drink ONE (1) Pre-Surgery Clear Ensure at 0410  AM the morning of surgery. Drink in one sitting. Do not sip.  This drink was given to you during your hospital  pre-op appointment visit. Nothing else to drink after  completing the  Pre-Surgery Clear Ensure or G2.          If you have questions, please contact your surgeon's office.   FOLLOW ANY ADDITIONAL PRE OP INSTRUCTIONS YOU RECEIVED FROM YOUR SURGEON'S OFFICE!!!     Oral Hygiene is also important to reduce your risk of infection.                                    Remember -  BRUSH YOUR TEETH THE MORNING OF SURGERY WITH YOUR REGULAR TOOTHPASTE  DENTURES WILL BE REMOVED PRIOR TO SURGERY PLEASE DO NOT APPLY "Poly grip" OR ADHESIVES!!!   Do NOT smoke after Midnight   Take these medicines the morning of surgery with A SIP OF WATER: amlodipine, metoprolol, ezetimibe                               You may not have any metal on your body including hair pins, jewelry, and body piercing             Do not wear make-up, lotions, powders, perfumes/cologne, or deodorant  Do not wear nail polish including gel and S&S, artificial/acrylic nails, or any other type of covering on natural nails including finger and toenails. If you have artificial nails, gel coating, etc. that needs to be removed by a nail salon please have this removed prior to surgery or surgery may need to be canceled/ delayed if the surgeon/ anesthesia feels like they are unable to be safely monitored.   Do not shave  48 hours prior to surgery.        Do not bring valuables to the hospital. [facility] IS NOT             RESPONSIBLE   FOR VALUABLES.   Contacts, glasses, dentures or bridgework may not be worn into surgery.   Bring small overnight bag day of surgery.   DO NOT BRING YOUR HOME MEDICATIONS TO THE HOSPITAL. PHARMACY WILL DISPENSE MEDICATIONS LISTED ON YOUR MEDICATION LIST TO YOU DURING YOUR ADMISSION IN THE HOSPITAL!    Patients discharged on the day of surgery will not be allowed to drive home.  Someone NEEDS to stay with you for the first 24 hours after anesthesia.   Special Instructions: Bring a copy of your healthcare power of attorney and living will documents the  day of surgery if you haven't scanned them before.              Please read over the following fact sheets you were given: IF YOU HAVE QUESTIONS ABOUT YOUR PRE-OP INSTRUCTIONS PLEASE CALL [phone]   If you received a COVID test during your pre-op visit  it is requested that you wear a mask when out in public, stay away from anyone that may not be feeling well and notify your surgeon if you develop symptoms. If you test positive for Covid or have been in contact with anyone that has tested positive in the last 10 days please notify you surgeon.    [facility] - Preparing for Surgery Before surgery, you can play an important role.  Because skin is not sterile, your skin needs to be as free of germs as possible.  You can reduce the number of germs on your skin by washing with CHG (chlorahexidine gluconate) soap before surgery.  CHG is an antiseptic cleaner which kills germs and bonds with the skin to continue killing germs even after washing. Please DO NOT use if you have an allergy to CHG or antibacterial soaps.  If your skin becomes reddened/irritated stop using the CHG and inform your nurse when you arrive at Short Stay. Do not shave (including legs and underarms) for at least 48 hours prior to the first CHG shower.  You may shave your face/neck. Please follow these instructions carefully:  1.  Shower with CHG Soap the night before surgery and the  morning of  Surgery.  2.  If you choose to wash your hair, wash your hair first as usual with your  normal  shampoo.  3.  After you shampoo, rinse your hair and body thoroughly to remove the  shampoo.                           4.  Use CHG as you would any other liquid soap.  You can apply chg directly  to the skin and wash                       Gently with a scrungie or clean washcloth.  5.  Apply the CHG Soap to your body ONLY FROM THE NECK DOWN.   Do not use on face/ open                           Wound or open sores. Avoid contact with eyes, ears  mouth and genitals (private parts).                       Wash face,  Genitals (private parts) with your normal soap.             6.  Wash thoroughly, paying special attention to the area where your surgery  will be performed.  7.  Thoroughly rinse your body with warm water from the neck down.  8.  DO NOT shower/wash with your normal soap after using and rinsing off  the CHG Soap.                9.  Pat yourself dry with a clean towel.            10.  Wear clean pajamas.            11.  Place clean sheets on your bed the night of your first shower and do not  sleep with pets. Day of Surgery : Do not apply any lotions/deodorants the morning of surgery.  Please wear clean clothes to the hospital/surgery center.  FAILURE TO FOLLOW THESE INSTRUCTIONS MAY RESULT IN THE CANCELLATION OF YOUR SURGERY PATIENT SIGNATURE_________________________________  NURSE SIGNATURE__________________________________  ________________________________________________________________________  Adam Phenix  An incentive spirometer is a tool that can help keep your lungs clear and active. This tool measures how well you are filling your lungs with each breath. Taking long deep breaths may help reverse or decrease the chance of developing breathing (pulmonary) problems (especially infection) following: A long period of time when you are unable to move or be active. BEFORE THE PROCEDURE  If the spirometer includes an indicator to show your best effort, your nurse or respiratory therapist will set it to a desired goal. If possible, sit up straight or lean slightly forward. Try not to slouch. Hold the incentive spirometer in an upright position. INSTRUCTIONS FOR USE  Sit on the edge of your bed if possible, or sit up as far as you can in bed or on a chair. Hold the incentive spirometer in an upright position. Breathe out normally. Place the mouthpiece in your mouth and seal your lips tightly around it. Breathe  in slowly and as deeply as possible, raising the piston or the ball toward the top of the column. Hold your breath for 3-5 seconds or for as long as possible. Allow the piston or ball to fall to the bottom of the  column. Remove the mouthpiece from your mouth and breathe out normally. Rest for a few seconds and repeat Steps 1 through 7 at least 10 times every 1-2 hours when you are awake. Take your time and take a few normal breaths between deep breaths. The spirometer may include an indicator to show your best effort. Use the indicator as a goal to work toward during each repetition. After each set of 10 deep breaths, practice coughing to be sure your lungs are clear. If you have an incision (the cut made at the time of surgery), support your incision when coughing by placing a pillow or rolled up towels firmly against it. Once you are able to get out of bed, walk around indoors and cough well. You may stop using the incentive spirometer when instructed by your caregiver.  RISKS AND COMPLICATIONS Take your time so you do not get dizzy or light-headed. If you are in pain, you may need to take or ask for pain medication before doing incentive spirometry. It is harder to take a deep breath if you are having pain. AFTER USE Rest and breathe slowly and easily. It can be helpful to keep track of a log of your progress. Your caregiver can provide you with a simple table to help with this. If you are using the spirometer at home, follow these instructions: SEEK MEDICAL CARE IF:  You are having difficultly using the spirometer. You have trouble using the spirometer as often as instructed. Your pain medication is not giving enough relief while using the spirometer. You develop fever of 100.5 F (38.1 C) or higher. SEEK IMMEDIATE MEDICAL CARE IF:  You cough up bloody sputum that had not been present before. You develop fever of 102 F (38.9 C) or greater. You develop worsening pain at or near the  incision site. MAKE SURE YOU:  Understand these instructions. Will watch your condition. Will get help right away if you are not doing well or get worse. Document Released: 03/25/2007 Document Revised: 02/04/2012 Document Reviewed: 05/26/2007 [facility] Patient Information 2014 [place], [place].   ________________________________________________________________________

## 2022-12-03 NOTE — Patient Instructions (Addendum)
Medication Instructions:  Your physician recommends that you continue on your current medications as directed. Please refer to the Current Medication list given to you today.  *If you need a refill on your cardiac medications before your next appointment, please call your pharmacy*   Lab Work: None Ordered If you have labs (blood work) drawn today and your tests are completely normal, you will receive your results only by: MyChart Message (if you have MyChart) OR A paper copy in the mail If you have any lab test that is abnormal or we need to change your treatment, we will call you to review the results.   Testing/Procedures: Your physician has requested that you have an echocardiogram. Echocardiography is a painless test that uses sound waves to create images of your heart. It provides your doctor with information about the size and shape of your heart and how well your heart's chambers and valves are working. This procedure takes approximately one hour. There are no restrictions for this procedure. Please do NOT wear cologne, perfume, aftershave, or lotions (deodorant is allowed). Please arrive 15 minutes prior to your appointment time.    Follow-Up: At [facility], you and your health needs are our priority.  As part of our continuing mission to provide you with exceptional heart care, we have created designated Provider Care Teams.  These Care Teams include your primary Cardiologist (physician) and Advanced Practice Providers (APPs -  Physician Assistants and Nurse Practitioners) who all work together to provide you with the care you need, when you need it.  We recommend signing up for the patient portal called "MyChart".  Sign up information is provided on this After Visit Summary.  MyChart is used to connect with patients for Virtual Visits (Telemedicine).  Patients are able to view lab/test results, encounter notes, upcoming appointments, etc.  Non-urgent messages can be sent to your  provider as well.   To learn more about what you can do with MyChart, go to [email or web address].    Your next appointment:   2 month(s)  The format for your next appointment:   In Person  Provider:   Robert Krasowski, MD    Other Instructions NA  

## 2022-12-04 ENCOUNTER — Ambulatory Visit: Payer: Medicare HMO | Attending: Cardiology

## 2022-12-04 ENCOUNTER — Telehealth: Payer: Self-pay | Admitting: Cardiology

## 2022-12-04 DIAGNOSIS — R011 Cardiac murmur, unspecified: Secondary | ICD-10-CM | POA: Diagnosis not present

## 2022-12-04 LAB — ECHOCARDIOGRAM COMPLETE
Area-P 1/2: 3.39 cm2
MV VTI: 1.94 cm2
S' Lateral: 3.5 cm

## 2022-12-04 NOTE — Telephone Encounter (Signed)
Pt states she needs to know if she can have her surgery by 12/07/22. Informed her once the echo results are in, RN will call.

## 2022-12-06 ENCOUNTER — Telehealth: Payer: Self-pay

## 2022-12-06 NOTE — Telephone Encounter (Signed)
Pt is calling back about echo results

## 2022-12-06 NOTE — Telephone Encounter (Signed)
Spoke with pt about Echo results. She stated that she has appts scheduled to start her knee surgery starting tomorrow. She stated that she needs to know what to do. Spoke with Dr. Agustin Cree, he stated that she would need to have a stress test to properly be evaluated before surgery. Pt was notified.

## 2022-12-07 ENCOUNTER — Encounter (HOSPITAL_COMMUNITY)
Admission: RE | Admit: 2022-12-07 | Discharge: 2022-12-07 | Disposition: A | Discharge status: Home / Self Care | Payer: Medicare HMO | Source: Ambulatory Visit | Attending: Orthopedic Surgery | Admitting: Orthopedic Surgery

## 2022-12-07 DIAGNOSIS — Z01818 Encounter for other preprocedural examination: Secondary | ICD-10-CM

## 2022-12-07 DIAGNOSIS — I10 Essential (primary) hypertension: Secondary | ICD-10-CM

## 2022-12-18 ENCOUNTER — Ambulatory Visit: Admit: 2022-12-18 | Payer: Medicare HMO | Admitting: Orthopedic Surgery

## 2022-12-18 SURGERY — ARTHROPLASTY, KNEE, TOTAL
Anesthesia: Spinal | Site: Knee | Laterality: Right

## 2023-01-31 ENCOUNTER — Other Ambulatory Visit: Payer: Self-pay

## 2023-02-01 ENCOUNTER — Ambulatory Visit: Payer: Medicare HMO | Attending: Cardiology | Admitting: Cardiology

## 2023-02-01 ENCOUNTER — Encounter: Payer: Self-pay | Admitting: Cardiology

## 2023-02-01 VITALS — BP 140/80 | HR 60 | Ht 62.0 in | Wt 147.0 lb

## 2023-02-01 DIAGNOSIS — M25562 Pain in left knee: Secondary | ICD-10-CM

## 2023-02-01 DIAGNOSIS — I491 Atrial premature depolarization: Secondary | ICD-10-CM

## 2023-02-01 DIAGNOSIS — G8929 Other chronic pain: Secondary | ICD-10-CM

## 2023-02-01 DIAGNOSIS — E7800 Pure hypercholesterolemia, unspecified: Secondary | ICD-10-CM | POA: Diagnosis not present

## 2023-02-01 DIAGNOSIS — Z01810 Encounter for preprocedural cardiovascular examination: Secondary | ICD-10-CM

## 2023-02-01 DIAGNOSIS — I10 Essential (primary) hypertension: Secondary | ICD-10-CM | POA: Diagnosis not present
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
## 2023-02-01 NOTE — Patient Instructions (Signed)
Medication Instructions:  Your physician recommends that you continue on your current medications as directed. Please refer to the Current Medication list given to you today.  *If you need a refill on your cardiac medications before your next appointment, please call your pharmacy*   Lab Work: None Ordered If you have labs (blood work) drawn today and your tests are completely normal, you will receive your results only by: MyChart Message (if you have MyChart) OR A paper copy in the mail If you have any lab test that is abnormal or we need to change your treatment, we will call you to review the results.   Testing/Procedures: None Ordered   Follow-Up: At [facility], you and your health needs are our priority.  As part of our continuing mission to provide you with exceptional heart care, we have created designated Provider Care Teams.  These Care Teams include your primary Cardiologist (physician) and Advanced Practice Providers (APPs -  Physician Assistants and Nurse Practitioners) who all work together to provide you with the care you need, when you need it.  We recommend signing up for the patient portal called "MyChart".  Sign up information is provided on this After Visit Summary.  MyChart is used to connect with patients for Virtual Visits (Telemedicine).  Patients are able to view lab/test results, encounter notes, upcoming appointments, etc.  Non-urgent messages can be sent to your provider as well.   To learn more about what you can do with MyChart, go to [email or web address].    Your next appointment:   Follow up as needed  The format for your next appointment:   In Person  Provider:   Robert Krasowski, MD    Other Instructions NA  

## 2023-02-11 ENCOUNTER — Encounter: Payer: Self-pay | Admitting: Cardiology

## 2023-02-11 DIAGNOSIS — D649 Anemia, unspecified: Secondary | ICD-10-CM | POA: Diagnosis not present

## 2023-02-11 DIAGNOSIS — E559 Vitamin D deficiency, unspecified: Secondary | ICD-10-CM | POA: Diagnosis not present

## 2023-02-11 DIAGNOSIS — R7301 Impaired fasting glucose: Secondary | ICD-10-CM | POA: Diagnosis not present

## 2023-02-11 DIAGNOSIS — E785 Hyperlipidemia, unspecified: Secondary | ICD-10-CM | POA: Diagnosis not present

## 2023-02-11 DIAGNOSIS — I10 Essential (primary) hypertension: Secondary | ICD-10-CM | POA: Diagnosis not present

## 2023-02-11 NOTE — Telephone Encounter (Signed)
Error

## 2023-02-12 ENCOUNTER — Telehealth: Payer: Self-pay | Admitting: Cardiology

## 2023-02-12 DIAGNOSIS — Z01810 Encounter for preprocedural cardiovascular examination: Secondary | ICD-10-CM

## 2023-02-12 LAB — LAB REPORT - SCANNED
A1c: 5.8
EGFR: 56

## 2023-02-12 NOTE — Telephone Encounter (Signed)
Patient is returning call.  °

## 2023-02-12 NOTE — Telephone Encounter (Signed)
Spoke with pt who states that her PCP mentioned a coronary calcium CT as she does want to have a lexiscan. Please advise

## 2023-02-12 NOTE — Telephone Encounter (Signed)
Spoke with pt regarding nuclear stress test. Pt request tho have cardiac CT as she does not think she could do the nuclear stress test. Discussed procedure and medications involved with both test. Pt request to have test precerted.

## 2023-02-12 NOTE — Telephone Encounter (Signed)
Pt asked if she can have coronary CT scan instead of having the stress test she was unable to complete (related to her pre op visit from 12/03/22). Please advise.

## 2023-02-12 NOTE — Telephone Encounter (Signed)
Left vm for pt to callback 

## 2023-02-12 NOTE — Addendum Note (Signed)
Addended by: Truddie Hidden on: 02/12/2023 04:04 PM   Modules accepted: Orders

## 2023-02-13 ENCOUNTER — Telehealth: Payer: Self-pay

## 2023-02-13 NOTE — Telephone Encounter (Signed)
Pt aware and instructions reviewed and mailed to pt. Pt verbalized understanding and had no additional questions.

## 2023-02-13 NOTE — Telephone Encounter (Signed)
-----   Message from Ciro Backer sent at 02/13/2023  8:54 AM EDT ----- Regarding: RE: cardiac CT Auth on file for [facility]. Good to schedule. ----- Message ----- From: Truddie Hidden, RN Sent: 02/12/2023   4:06 PM EDT To: Melony Overly; # Subject: cardiac CT                                     Please precert before scheduling as pt wants to make sure her insurance will cover. Recommended to have a nuclear stress test but would rather have CT at [facility]. Thanks Shenise Wolgamott

## 2023-02-22 ENCOUNTER — Telehealth (HOSPITAL_COMMUNITY): Payer: Self-pay | Admitting: *Deleted

## 2023-02-22 NOTE — Telephone Encounter (Signed)
Reaching out to patient to offer assistance regarding upcoming cardiac imaging study; pt verbalizes understanding of appt date/time, parking situation and where to check in, pre-test NPO status  and verified current allergies; name and call back number provided for further questions should they arise  Judith Demps RN Navigator Cardiac Imaging [facility] [phone] office [phone] cell  Patient aware to arrive at 1:30pm.  

## 2023-02-25 ENCOUNTER — Ambulatory Visit (HOSPITAL_COMMUNITY)
Admission: RE | Admit: 2023-02-25 | Discharge: 2023-02-25 | Disposition: A | Discharge status: Home / Self Care | Payer: Medicare HMO | Source: Ambulatory Visit | Attending: Cardiology | Admitting: Cardiology

## 2023-02-25 DIAGNOSIS — Z01810 Encounter for preprocedural cardiovascular examination: Secondary | ICD-10-CM | POA: Insufficient documentation

## 2023-02-25 DIAGNOSIS — I2510 Atherosclerotic heart disease of native coronary artery without angina pectoris: Secondary | ICD-10-CM | POA: Diagnosis not present

## 2023-02-25 MED ORDER — METOPROLOL TARTRATE 5 MG/5ML IV SOLN: 10.0000 mg | Freq: Once | INTRAVENOUS | Status: DC

## 2023-02-25 MED ORDER — NITROGLYCERIN 0.4 MG SL SUBL
SUBLINGUAL_TABLET | SUBLINGUAL | Status: AC
  Administered 2023-02-25: 0.8 mg via SUBLINGUAL
  Filled 2023-02-25: qty 2

## 2023-02-25 MED ORDER — METOPROLOL TARTRATE 5 MG/5ML IV SOLN
INTRAVENOUS | Status: AC
  Filled 2023-02-25: qty 10

## 2023-02-25 MED ORDER — NITROGLYCERIN 0.4 MG SL SUBL: 0.8000 mg | SUBLINGUAL_TABLET | Freq: Once | SUBLINGUAL | Status: AC

## 2023-02-25 MED ORDER — IOHEXOL 350 MG/ML SOLN
100.0000 mL | Freq: Once | INTRAVENOUS | Status: AC | PRN
  Administered 2023-02-25: 100 mL via INTRAVENOUS

## 2023-03-01 ENCOUNTER — Telehealth: Payer: Self-pay

## 2023-03-01 NOTE — Telephone Encounter (Signed)
Results reviewed with pt as per Dr. Krasowski's note.  Pt verbalized understanding and had no additional questions. Routed to PCP  

## 2023-03-20 ENCOUNTER — Ambulatory Visit: Payer: Medicare HMO | Attending: Cardiology | Admitting: Cardiology

## 2023-03-20 ENCOUNTER — Encounter: Payer: Self-pay | Admitting: Cardiology

## 2023-03-20 VITALS — BP 160/60 | HR 63 | Ht 62.0 in | Wt 144.4 lb

## 2023-03-20 DIAGNOSIS — I2510 Atherosclerotic heart disease of native coronary artery without angina pectoris: Secondary | ICD-10-CM

## 2023-03-20 DIAGNOSIS — I491 Atrial premature depolarization: Secondary | ICD-10-CM | POA: Diagnosis not present

## 2023-03-20 DIAGNOSIS — E7800 Pure hypercholesterolemia, unspecified: Secondary | ICD-10-CM | POA: Diagnosis not present

## 2023-03-20 DIAGNOSIS — I10 Essential (primary) hypertension: Secondary | ICD-10-CM

## 2023-03-20 NOTE — Patient Instructions (Signed)
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
## 2023-08-15 DIAGNOSIS — Z6825 Body mass index (BMI) 25.0-25.9, adult: Secondary | ICD-10-CM | POA: Diagnosis not present

## 2023-08-15 DIAGNOSIS — I491 Atrial premature depolarization: Secondary | ICD-10-CM | POA: Diagnosis not present

## 2023-08-15 DIAGNOSIS — E785 Hyperlipidemia, unspecified: Secondary | ICD-10-CM | POA: Diagnosis not present

## 2023-08-15 DIAGNOSIS — R634 Abnormal weight loss: Secondary | ICD-10-CM | POA: Diagnosis not present

## 2023-08-15 DIAGNOSIS — M8589 Other specified disorders of bone density and structure, multiple sites: Secondary | ICD-10-CM | POA: Diagnosis not present

## 2023-08-15 DIAGNOSIS — R7301 Impaired fasting glucose: Secondary | ICD-10-CM | POA: Diagnosis not present

## 2023-08-15 DIAGNOSIS — I10 Essential (primary) hypertension: Secondary | ICD-10-CM | POA: Diagnosis not present

## 2023-08-15 DIAGNOSIS — D649 Anemia, unspecified: Secondary | ICD-10-CM | POA: Diagnosis not present

## 2023-08-15 DIAGNOSIS — E559 Vitamin D deficiency, unspecified: Secondary | ICD-10-CM | POA: Diagnosis not present

## 2023-09-05 ENCOUNTER — Telehealth: Payer: Self-pay

## 2023-09-05 NOTE — Telephone Encounter (Signed)
   [facility] Pre-operative Risk Assessment    Request for surgical clearance:  What type of surgery is being performed? Right Total Knee Arthroplasty    When is this surgery scheduled? : 10/22/2023   What type of clearance is required (medical clearance vs. Pharmacy clearance to hold med vs. Both)? Both  Are there any medications that need to be held prior to surgery and how long?Not specified   Practice name and name of physician performing surgery? Dr. Durene Romans at [facility]   What is your office phone number: [phone]    7.   What is your office fax number: [phone]/ [phone]  8.   Anesthesia type (None, local, MAC, general) ? Spinal Anesthesia    Tiburcio Pea Nathanial Arrighi 09/05/2023, 4:16 PM  _________________________________________________________________   (provider comments below)

## 2023-09-06 NOTE — Telephone Encounter (Signed)
   Name: Laura Rocha  DOB: [birth year]  MRN: [id number]  Primary Cardiologist: None  Chart reviewed as part of pre-operative protocol coverage. The patient has an upcoming visit scheduled with Dr. Bing Matter on 09/27/2023 at which time clearance can be addressed in case there are any issues that would impact surgical recommendations.  R TKA is not scheduled until 10/22/2023 as below. I added preop FYI to appointment note so that provider is aware to address at time of outpatient visit.  Per office protocol the cardiology provider should forward their finalized clearance decision and recommendations regarding antiplatelet therapy to the requesting party below.    I will route this message as FYI to requesting party and remove this message from the preop box as separate preop APP input not needed at this time.   Please call with any questions.  Joylene Grapes, NP  09/06/2023, 1:50 PM

## 2023-09-11 DIAGNOSIS — M1711 Unilateral primary osteoarthritis, right knee: Secondary | ICD-10-CM | POA: Diagnosis not present

## 2023-09-11 DIAGNOSIS — Z96652 Presence of left artificial knee joint: Secondary | ICD-10-CM | POA: Diagnosis not present

## 2023-09-16 DIAGNOSIS — H5203 Hypermetropia, bilateral: Secondary | ICD-10-CM | POA: Diagnosis not present

## 2023-09-27 ENCOUNTER — Encounter: Payer: Self-pay | Admitting: Cardiology

## 2023-09-27 ENCOUNTER — Ambulatory Visit: Payer: Medicare HMO | Attending: Cardiology | Admitting: Cardiology

## 2023-09-27 VITALS — BP 120/50 | HR 67 | Ht 62.5 in | Wt 146.8 lb

## 2023-09-27 DIAGNOSIS — I2510 Atherosclerotic heart disease of native coronary artery without angina pectoris: Secondary | ICD-10-CM

## 2023-09-27 DIAGNOSIS — E7800 Pure hypercholesterolemia, unspecified: Secondary | ICD-10-CM | POA: Diagnosis not present

## 2023-09-27 DIAGNOSIS — I10 Essential (primary) hypertension: Secondary | ICD-10-CM | POA: Diagnosis not present

## 2023-09-27 DIAGNOSIS — Z01810 Encounter for preprocedural cardiovascular examination: Secondary | ICD-10-CM | POA: Diagnosis not present

## 2023-09-27 NOTE — Patient Instructions (Signed)
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
## 2023-10-02 DIAGNOSIS — M25561 Pain in right knee: Secondary | ICD-10-CM | POA: Diagnosis not present

## 2023-10-03 DIAGNOSIS — Z0000 Encounter for general adult medical examination without abnormal findings: Secondary | ICD-10-CM | POA: Diagnosis not present

## 2023-10-03 DIAGNOSIS — Z9181 History of falling: Secondary | ICD-10-CM | POA: Diagnosis not present

## 2023-10-07 NOTE — Patient Instructions (Signed)
SURGICAL WAITING ROOM VISITATION  Patients having surgery or a procedure may have no more than 2 support people in the waiting area - these visitors may rotate.    Children under the age of 72 must have an adult with them who is not the patient.   If the patient needs to stay at the hospital during part of their recovery, the visitor guidelines for inpatient rooms apply. Pre-op nurse will coordinate an appropriate time for 1 support person to accompany patient in pre-op.  This support person may not rotate.    Please refer to the [facility] website for the visitor guidelines for Inpatients (after your surgery is over and you are in a regular room).       Your procedure is scheduled on: 10-22-23   Report to [facility] Main Entrance    Report to admitting at        0610  AM   Call this number if you have problems the morning of surgery [phone]   Do not eat food :After Midnight.   After Midnight you may have the following liquids until _0540_____ AM  DAY OF SURGERY   then nothing by mouth  Water Non-Citrus Juices (without pulp, NO RED-Apple, White grape, White cranberry) Black Coffee (NO MILK/CREAM OR CREAMERS, sugar ok)  Clear Tea (NO MILK/CREAM OR CREAMERS, sugar ok) regular and decaf                             Plain Jell-O (NO RED)                                           Fruit ices (not with fruit pulp, NO RED)                                     Popsicles (NO RED)                                                               Sports drinks like Gatorade (NO RED)                   The day of surgery:  Drink ONE (1) Pre-Surgery Clear Ensure  at   0530 AM the morning of surgery. Drink in one sitting. Do not sip.  This drink was given to you during your hospital  pre-op appointment visit. Nothing else to drink after completing the  Pre-Surgery Clear Ensure by 0540 am .          If you have questions, please contact your surgeon's office.   FOLLOW BOWEL  PREP AND ANY ADDITIONAL PRE OP INSTRUCTIONS YOU RECEIVED FROM YOUR SURGEON'S OFFICE!!!     Oral Hygiene is also important to reduce your risk of infection.                                    Remember - BRUSH YOUR TEETH THE MORNING OF SURGERY WITH YOUR REGULAR TOOTHPASTE  DENTURES WILL BE REMOVED PRIOR TO SURGERY PLEASE DO NOT APPLY "Poly grip" OR ADHESIVES!!!   Do NOT smoke after Midnight   Stop all vitamins and herbal supplements 7 days before surgery.   Take these medicines the morning of surgery with A SIP OF WATER: metoprolol, Zetia, amlodipine                                You may not have any metal on your body including hair pins, jewelry, and body piercing             Do not wear make-up, lotions, powders, perfumes/cologne, or deodorant  Do not wear nail polish including gel and S&S, artificial/acrylic nails, or any other type of covering on natural nails including finger and toenails. If you have artificial nails, gel coating, etc. that needs to be removed by a nail salon please have this removed prior to surgery or surgery may need to be canceled/ delayed if the surgeon/ anesthesia feels like they are unable to be safely monitored.   Do not shave  5 days prior to surgery.           Do not bring valuables to the hospital. [facility] IS NOT             RESPONSIBLE   FOR VALUABLES.   Contacts, glasses, dentures or bridgework may not be worn into surgery.   Bring small overnight bag day of surgery.   DO NOT BRING YOUR HOME MEDICATIONS TO THE HOSPITAL. PHARMACY WILL DISPENSE MEDICATIONS LISTED ON YOUR MEDICATION LIST TO YOU DURING YOUR ADMISSION IN THE HOSPITAL!    Patients discharged on the day of surgery will not be allowed to drive home.  Someone NEEDS to stay with you for the first 24 hours after anesthesia.   Special Instructions: Bring a copy of your healthcare power of attorney and living will documents the day of surgery if you haven't scanned them before.               Please read over the following fact sheets you were given: IF YOU HAVE QUESTIONS ABOUT YOUR PRE-OP INSTRUCTIONS PLEASE CALL [phone]    If you test positive for Covid or have been in contact with anyone that has tested positive in the last 10 days please notify you surgeon.      Pre-operative 5 CHG Bath Instructions   You can play a key role in reducing the risk of infection after surgery. Your skin needs to be as free of germs as possible. You can reduce the number of germs on your skin by washing with CHG (chlorhexidine gluconate) soap before surgery. CHG is an antiseptic soap that kills germs and continues to kill germs even after washing.   DO NOT use if you have an allergy to chlorhexidine/CHG or antibacterial soaps. If your skin becomes reddened or irritated, stop using the CHG and notify one of our RNs at [phone].   Please shower with the CHG soap starting 4 days before surgery using the following schedule:     Please keep in mind the following:  DO NOT shave, including legs and underarms, starting the day of your first shower.   You may shave your face at any point before/day of surgery.  Place clean sheets on your bed the day you start using CHG soap. Use a clean washcloth (not used since being washed) for each shower. DO NOT sleep  with pets once you start using the CHG.   CHG Shower Instructions:  If you choose to wash your hair and private area, wash first with your normal shampoo/soap.  After you use shampoo/soap, rinse your hair and body thoroughly to remove shampoo/soap residue.  Turn the water OFF and apply about 3 tablespoons (45 ml) of CHG soap to a CLEAN washcloth.  Apply CHG soap ONLY FROM YOUR NECK DOWN TO YOUR TOES (washing for 3-5 minutes)  DO NOT use CHG soap on face, private areas, open wounds, or sores.  Pay special attention to the area where your surgery is being performed.  If you are having back surgery, having someone wash your back for you  may be helpful. Wait 2 minutes after CHG soap is applied, then you may rinse off the CHG soap.  Pat dry with a clean towel  Put on clean clothes/pajamas   If you choose to wear lotion, please use ONLY the CHG-compatible lotions on the back of this paper.     Additional instructions for the day of surgery: DO NOT APPLY any lotions, deodorants, cologne, or perfumes.   Put on clean/comfortable clothes.  Brush your teeth.  Ask your nurse before applying any prescription medications to the skin.      CHG Compatible Lotions   Aveeno Moisturizing lotion  Cetaphil Moisturizing Cream  Cetaphil Moisturizing Lotion  Clairol Herbal Essence Moisturizing Lotion, Dry Skin  Clairol Herbal Essence Moisturizing Lotion, Extra Dry Skin  Clairol Herbal Essence Moisturizing Lotion, Normal Skin  Curel Age Defying Therapeutic Moisturizing Lotion with Alpha Hydroxy  Curel Extreme Care Body Lotion  Curel Soothing Hands Moisturizing Hand Lotion  Curel Therapeutic Moisturizing Cream, Fragrance-Free  Curel Therapeutic Moisturizing Lotion, Fragrance-Free  Curel Therapeutic Moisturizing Lotion, Original Formula  Eucerin Daily Replenishing Lotion  Eucerin Dry Skin Therapy Plus Alpha Hydroxy Crme  Eucerin Dry Skin Therapy Plus Alpha Hydroxy Lotion  Eucerin Original Crme  Eucerin Original Lotion  Eucerin Plus Crme Eucerin Plus Lotion  Eucerin TriLipid Replenishing Lotion  Keri Anti-Bacterial Hand Lotion  Keri Deep Conditioning Original Lotion Dry Skin Formula Softly Scented  Keri Deep Conditioning Original Lotion, Fragrance Free Sensitive Skin Formula  Keri Lotion Fast Absorbing Fragrance Free Sensitive Skin Formula  Keri Lotion Fast Absorbing Softly Scented Dry Skin Formula  Keri Original Lotion  Keri Skin Renewal Lotion Keri Silky Smooth Lotion  Keri Silky Smooth Sensitive Skin Lotion  Nivea Body Creamy Conditioning Oil  Nivea Body Extra Enriched Lotion  Nivea Body Original Lotion  Nivea Body  Sheer Moisturizing Lotion Nivea Crme  Nivea Skin Firming Lotion  NutraDerm 30 Skin Lotion  NutraDerm Skin Lotion  NutraDerm Therapeutic Skin Cream  NutraDerm Therapeutic Skin Lotion  ProShield Protective Hand Cream   Incentive Spirometer  An incentive spirometer is a tool that can help keep your lungs clear and active. This tool measures how well you are filling your lungs with each breath. Taking long deep breaths may help reverse or decrease the chance of developing breathing (pulmonary) problems (especially infection) following: A long period of time when you are unable to move or be active. BEFORE THE PROCEDURE  If the spirometer includes an indicator to show your best effort, your nurse or respiratory therapist will set it to a desired goal. If possible, sit up straight or lean slightly forward. Try not to slouch. Hold the incentive spirometer in an upright position. INSTRUCTIONS FOR USE  Sit on the edge of your bed if possible, or sit up as far  as you can in bed or on a chair. Hold the incentive spirometer in an upright position. Breathe out normally. Place the mouthpiece in your mouth and seal your lips tightly around it. Breathe in slowly and as deeply as possible, raising the piston or the ball toward the top of the column. Hold your breath for 3-5 seconds or for as long as possible. Allow the piston or ball to fall to the bottom of the column. Remove the mouthpiece from your mouth and breathe out normally. Rest for a few seconds and repeat Steps 1 through 7 at least 10 times every 1-2 hours when you are awake. Take your time and take a few normal breaths between deep breaths. The spirometer may include an indicator to show your best effort. Use the indicator as a goal to work toward during each repetition. After each set of 10 deep breaths, practice coughing to be sure your lungs are clear. If you have an incision (the cut made at the time of surgery), support your incision when  coughing by placing a pillow or rolled up towels firmly against it. Once you are able to get out of bed, walk around indoors and cough well. You may stop using the incentive spirometer when instructed by your caregiver.  RISKS AND COMPLICATIONS Take your time so you do not get dizzy or light-headed. If you are in pain, you may need to take or ask for pain medication before doing incentive spirometry. It is harder to take a deep breath if you are having pain. AFTER USE Rest and breathe slowly and easily. It can be helpful to keep track of a log of your progress. Your caregiver can provide you with a simple table to help with this. If you are using the spirometer at home, follow these instructions: SEEK MEDICAL CARE IF:  You are having difficultly using the spirometer. You have trouble using the spirometer as often as instructed. Your pain medication is not giving enough relief while using the spirometer. You develop fever of 100.5 F (38.1 C) or higher. SEEK IMMEDIATE MEDICAL CARE IF:  You cough up bloody sputum that had not been present before. You develop fever of 102 F (38.9 C) or greater. You develop worsening pain at or near the incision site. MAKE SURE YOU:  Understand these instructions. Will watch your condition. Will get help right away if you are not doing well or get worse. Document Released: 03/25/2007 Document Revised: 02/04/2012 Document Reviewed: 05/26/2007  [facility] Patient Information 2014 [place], [place].   ________________________________________________________________________

## 2023-10-07 NOTE — Progress Notes (Addendum)
PCP - Foye Deer, MD Cardiologist - Gypsy Balsam, MD lov 09-27-23 epic  clearance visit  PPM/ICD -  Device Orders -  Rep Notified -   Chest x-ray -  EKG - 09-27-23 epic Stress Test -  ECHO - 12-04-22 epic Cardiac Cath -  CT coronary 03-02-23 epic  Sleep Study -  CPAP -   Fasting Blood Sugar -  Checks Blood Sugar _____ times a day  Blood Thinner Instructions: Aspirin Instructions:81mg   ERAS Protcol - PRE-SURGERY Ensure    COVID vaccine -  Activity--Able to complete ADL's without CP or SOB Anesthesia review: non obst. CAD, HTN  Patient denies shortness of breath, fever, cough and chest pain at PAT appointment   All instructions explained to the patient, with a verbal understanding of the material. Patient agrees to go over the instructions while at home for a better understanding. Patient also instructed to self quarantine after being tested for COVID-19. The opportunity to ask questions was provided.

## 2023-10-10 DIAGNOSIS — B351 Tinea unguium: Secondary | ICD-10-CM | POA: Diagnosis not present

## 2023-10-10 DIAGNOSIS — M2042 Other hammer toe(s) (acquired), left foot: Secondary | ICD-10-CM | POA: Diagnosis not present

## 2023-10-10 DIAGNOSIS — M2041 Other hammer toe(s) (acquired), right foot: Secondary | ICD-10-CM | POA: Diagnosis not present

## 2023-10-11 ENCOUNTER — Encounter (HOSPITAL_COMMUNITY): Payer: Self-pay

## 2023-10-11 ENCOUNTER — Encounter (HOSPITAL_COMMUNITY)
Admission: RE | Admit: 2023-10-11 | Discharge: 2023-10-11 | Disposition: A | Discharge status: Home / Self Care | Payer: Medicare HMO | Source: Ambulatory Visit | Attending: Orthopedic Surgery | Admitting: Orthopedic Surgery

## 2023-10-11 ENCOUNTER — Other Ambulatory Visit: Payer: Self-pay

## 2023-10-11 VITALS — BP 124/65 | Temp 98.9°F | Ht 62.0 in | Wt 140.0 lb

## 2023-10-11 DIAGNOSIS — Z01818 Encounter for other preprocedural examination: Secondary | ICD-10-CM

## 2023-10-11 DIAGNOSIS — Z01812 Encounter for preprocedural laboratory examination: Secondary | ICD-10-CM | POA: Insufficient documentation

## 2023-10-11 DIAGNOSIS — I10 Essential (primary) hypertension: Secondary | ICD-10-CM | POA: Diagnosis not present

## 2023-10-11 HISTORY — DX: Prediabetes: R73.03

## 2023-10-11 LAB — BASIC METABOLIC PANEL
Anion gap: 9 (ref 5–15)
BUN: 27 mg/dL — ABNORMAL HIGH (ref 8–23)
CO2: 30 mmol/L (ref 22–32)
Calcium: 9.8 mg/dL (ref 8.9–10.3)
Chloride: 106 mmol/L (ref 98–111)
Creatinine, Ser: 1.11 mg/dL — ABNORMAL HIGH (ref 0.44–1.00)
GFR, Estimated: 48 mL/min — ABNORMAL LOW (ref >60)
Glucose, Bld: 116 mg/dL — ABNORMAL HIGH (ref 70–99)
Potassium: 4.4 mmol/L (ref 3.5–5.1)
Sodium: 145 mmol/L (ref 135–145)

## 2023-10-11 LAB — CBC
HCT: 38.8 % (ref 36.0–46.0)
Hemoglobin: 12.8 g/dL (ref 12.0–15.0)
MCH: 31.4 pg (ref 26.0–34.0)
MCHC: 33 g/dL (ref 30.0–36.0)
MCV: 95.3 fL (ref 80.0–100.0)
Platelets: 160 10*3/uL (ref 150–400)
RBC: 4.07 MIL/uL (ref 3.87–5.11)
RDW: 13.3 % (ref 11.5–15.5)
WBC: 6.7 10*3/uL (ref 4.0–10.5)
nRBC: 0 % (ref 0.0–0.2)

## 2023-10-11 LAB — SURGICAL PCR SCREEN
MRSA, PCR: NEGATIVE
Staphylococcus aureus: NEGATIVE

## 2023-10-17 DIAGNOSIS — Z0100 Encounter for examination of eyes and vision without abnormal findings: Secondary | ICD-10-CM | POA: Diagnosis not present
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
## 2023-10-21 ENCOUNTER — Encounter (HOSPITAL_COMMUNITY): Payer: Self-pay | Admitting: Orthopedic Surgery

## 2023-10-21 NOTE — Anesthesia Preprocedure Evaluation (Signed)
Anesthesia Evaluation  Patient identified by MRN, date of birth, ID band Patient awake    Reviewed: Allergy & Precautions, NPO status , Patient's Chart, lab work & pertinent test results, reviewed documented beta blocker date and time   Airway Mallampati: II  TM Distance: >3 FB     Dental  (+) Partial Upper, Dental Advisory Given   Pulmonary neg pulmonary ROS   Pulmonary exam normal breath sounds clear to auscultation       Cardiovascular hypertension, Pt. on medications and Pt. on home beta blockers + CAD  Normal cardiovascular exam Rhythm:Regular Rate:Normal  EKG 09/27/23 NSR, LAD   Echo 12/04/22 1. GLS -14.9. Left ventricular ejection fraction, by estimation, is 60 to  65%. The left ventricle has normal function. The left ventricle has no  regional wall motion abnormalities. Left ventricular diastolic parameters  are consistent with Grade I  diastolic dysfunction (impaired relaxation).   2. Right ventricular systolic function is normal. The right ventricular  size is normal. There is normal pulmonary artery systolic pressure.   3. Left atrial size was severely dilated.   4. The mitral valve is normal in structure. Mild mitral valve  regurgitation. No evidence of mitral stenosis. Moderate mitral annular  calcification.   5. The aortic valve is calcified. There is moderate calcification of the  aortic valve. There is moderate thickening of the aortic valve. Aortic  valve regurgitation is not visualized. Aortic valve  sclerosis/calcification is present, without any evidence  of aortic stenosis.   6. The inferior vena cava is normal in size with greater than 50%  respiratory variability, suggesting right atrial pressure of 3 mmHg.      Neuro/Psych Peripheral neuropathy-hands  Neuromuscular disease  negative psych ROS   GI/Hepatic Neg liver ROS,GERD  Medicated,,  Endo/Other  Pre diabetes Hyperlipidemia  Renal/GU Renal  InsufficiencyRenal disease  negative genitourinary   Musculoskeletal  (+) Arthritis , Osteoarthritis,  OA right knee Osteopenia   Abdominal   Peds  Hematology  (+) Blood dyscrasia, anemia   Anesthesia Other Findings   Reproductive/Obstetrics                             Anesthesia Physical Anesthesia Plan  ASA: 2  Anesthesia Plan: Spinal   Post-op Pain Management: Regional block* and Minimal or no pain anticipated   Induction: Intravenous  PONV Risk Score and Plan: 3 and Treatment may vary due to age or medical condition, Propofol infusion, Ondansetron and Dexamethasone  Airway Management Planned: Natural Airway and Simple Face Mask  Additional Equipment: None  Intra-op Plan:   Post-operative Plan:   Informed Consent: I have reviewed the patients History and Physical, chart, labs and discussed the procedure including the risks, benefits and alternatives for the proposed anesthesia with the patient or authorized representative who has indicated his/her understanding and acceptance.     Dental advisory given  Plan Discussed with: CRNA and Anesthesiologist  Anesthesia Plan Comments:         Anesthesia Quick Evaluation

## 2023-10-22 ENCOUNTER — Ambulatory Visit (HOSPITAL_BASED_OUTPATIENT_CLINIC_OR_DEPARTMENT_OTHER): Payer: Medicare HMO | Admitting: Anesthesiology

## 2023-10-22 ENCOUNTER — Other Ambulatory Visit: Payer: Self-pay

## 2023-10-22 ENCOUNTER — Observation Stay (HOSPITAL_COMMUNITY)
Admission: RE | Admit: 2023-10-22 | Discharge: 2023-10-23 | Disposition: A | Discharge status: Home / Self Care | Payer: Medicare HMO | Attending: Orthopedic Surgery | Admitting: Orthopedic Surgery

## 2023-10-22 ENCOUNTER — Encounter (HOSPITAL_COMMUNITY): Payer: Self-pay | Admitting: Orthopedic Surgery

## 2023-10-22 ENCOUNTER — Encounter (HOSPITAL_COMMUNITY)
Admission: RE | Disposition: A | Discharge status: Home / Self Care | Payer: Self-pay | Source: Home / Self Care | Attending: Orthopedic Surgery

## 2023-10-22 ENCOUNTER — Ambulatory Visit (HOSPITAL_COMMUNITY): Payer: Medicare HMO | Admitting: Physician Assistant

## 2023-10-22 DIAGNOSIS — Z7982 Long term (current) use of aspirin: Secondary | ICD-10-CM | POA: Diagnosis not present

## 2023-10-22 DIAGNOSIS — I2510 Atherosclerotic heart disease of native coronary artery without angina pectoris: Secondary | ICD-10-CM | POA: Diagnosis not present

## 2023-10-22 DIAGNOSIS — Z79899 Other long term (current) drug therapy: Secondary | ICD-10-CM | POA: Diagnosis not present

## 2023-10-22 DIAGNOSIS — M1711 Unilateral primary osteoarthritis, right knee: Secondary | ICD-10-CM

## 2023-10-22 DIAGNOSIS — I10 Essential (primary) hypertension: Secondary | ICD-10-CM | POA: Insufficient documentation

## 2023-10-22 DIAGNOSIS — Z96652 Presence of left artificial knee joint: Secondary | ICD-10-CM | POA: Insufficient documentation

## 2023-10-22 DIAGNOSIS — G8918 Other acute postprocedural pain: Secondary | ICD-10-CM | POA: Diagnosis not present

## 2023-10-22 DIAGNOSIS — Z96651 Presence of right artificial knee joint: Principal | ICD-10-CM

## 2023-10-22 HISTORY — PX: TOTAL KNEE ARTHROPLASTY: SHX125

## 2023-10-22 SURGERY — ARTHROPLASTY, KNEE, TOTAL
Anesthesia: Spinal | Site: Knee | Laterality: Right

## 2023-10-22 MED ORDER — ALUM & MAG HYDROXIDE-SIMETH 200-200-20 MG/5ML PO SUSP: 30.0000 mL | ORAL | Status: DC | PRN

## 2023-10-22 MED ORDER — ONDANSETRON HCL 4 MG PO TABS
4.0000 mg | ORAL_TABLET | Freq: Four times a day (QID) | ORAL | Status: DC | PRN
  Administered 2023-10-23 (×2): 4 mg via ORAL
  Filled 2023-10-22 (×3): qty 1

## 2023-10-22 MED ORDER — SODIUM CHLORIDE (PF) 0.9 % IJ SOLN
INTRAMUSCULAR | Status: AC
  Filled 2023-10-22: qty 30

## 2023-10-22 MED ORDER — CEFAZOLIN SODIUM-DEXTROSE 2-4 GM/100ML-% IV SOLN
2.0000 g | Freq: Four times a day (QID) | INTRAVENOUS | Status: AC
Start: 2023-10-22 — End: 2023-10-23
  Administered 2023-10-22 (×2): 2 g via INTRAVENOUS
  Filled 2023-10-22 (×2): qty 100

## 2023-10-22 MED ORDER — POLYETHYLENE GLYCOL 3350 17 G PO PACK
17.0000 g | PACK | Freq: Two times a day (BID) | ORAL | Status: DC
  Filled 2023-10-22: qty 1

## 2023-10-22 MED ORDER — OXYCODONE HCL 5 MG PO TABS: 5.0000 mg | ORAL_TABLET | Freq: Once | ORAL | Status: DC | PRN

## 2023-10-22 MED ORDER — PHENOL 1.4 % MT LIQD: 1.0000 | OROMUCOSAL | Status: DC | PRN

## 2023-10-22 MED ORDER — PHENYLEPHRINE 80 MCG/ML (10ML) SYRINGE FOR IV PUSH (FOR BLOOD PRESSURE SUPPORT)
PREFILLED_SYRINGE | INTRAVENOUS | Status: DC | PRN
  Administered 2023-10-22: 80 ug via INTRAVENOUS
  Administered 2023-10-22: 160 ug via INTRAVENOUS

## 2023-10-22 MED ORDER — EZETIMIBE 10 MG PO TABS
10.0000 mg | ORAL_TABLET | Freq: Every day | ORAL | Status: DC
  Filled 2023-10-22: qty 1

## 2023-10-22 MED ORDER — PROPOFOL 500 MG/50ML IV EMUL
INTRAVENOUS | Status: AC
  Filled 2023-10-22: qty 50

## 2023-10-22 MED ORDER — POVIDONE-IODINE 10 % EX SWAB: 2.0000 | Freq: Once | CUTANEOUS | Status: DC

## 2023-10-22 MED ORDER — DEXAMETHASONE SODIUM PHOSPHATE 10 MG/ML IJ SOLN
10.0000 mg | Freq: Once | INTRAMUSCULAR | Status: AC
  Administered 2023-10-23: 10 mg via INTRAVENOUS
  Filled 2023-10-22: qty 1

## 2023-10-22 MED ORDER — MELOXICAM 15 MG PO TABS
15.0000 mg | ORAL_TABLET | Freq: Every day | ORAL | Status: DC
  Administered 2023-10-23: 15 mg via ORAL
  Filled 2023-10-22 (×2): qty 1

## 2023-10-22 MED ORDER — ONDANSETRON HCL 4 MG/2ML IJ SOLN
INTRAMUSCULAR | Status: DC | PRN
  Administered 2023-10-22: 4 mg via INTRAVENOUS

## 2023-10-22 MED ORDER — SODIUM CHLORIDE (PF) 0.9 % IJ SOLN
INTRAMUSCULAR | Status: DC | PRN
  Administered 2023-10-22: 30 mL

## 2023-10-22 MED ORDER — 0.9 % SODIUM CHLORIDE (POUR BTL) OPTIME
TOPICAL | Status: DC | PRN
  Administered 2023-10-22: 1000 mL

## 2023-10-22 MED ORDER — KETOROLAC TROMETHAMINE 30 MG/ML IJ SOLN
INTRAMUSCULAR | Status: AC
  Filled 2023-10-22: qty 1

## 2023-10-22 MED ORDER — TRANEXAMIC ACID-NACL 1000-0.7 MG/100ML-% IV SOLN
1000.0000 mg | INTRAVENOUS | Status: AC
  Administered 2023-10-22: 1000 mg via INTRAVENOUS
  Filled 2023-10-22: qty 100

## 2023-10-22 MED ORDER — BISACODYL 10 MG RE SUPP: 10.0000 mg | Freq: Every day | RECTAL | Status: DC | PRN

## 2023-10-22 MED ORDER — PROPOFOL 10 MG/ML IV BOLUS
INTRAVENOUS | Status: DC | PRN
  Administered 2023-10-22: 30 mg via INTRAVENOUS

## 2023-10-22 MED ORDER — ONDANSETRON HCL 4 MG/2ML IJ SOLN
INTRAMUSCULAR | Status: AC
  Filled 2023-10-22: qty 2

## 2023-10-22 MED ORDER — OXYCODONE HCL 5 MG PO TABS
2.5000 mg | ORAL_TABLET | ORAL | Status: DC | PRN
  Administered 2023-10-23: 5 mg via ORAL

## 2023-10-22 MED ORDER — FENTANYL CITRATE PF 50 MCG/ML IJ SOSY: 25.0000 ug | PREFILLED_SYRINGE | INTRAMUSCULAR | Status: DC | PRN

## 2023-10-22 MED ORDER — OXYCODONE HCL 5 MG/5ML PO SOLN: 5.0000 mg | Freq: Once | ORAL | Status: DC | PRN

## 2023-10-22 MED ORDER — DEXAMETHASONE SODIUM PHOSPHATE 10 MG/ML IJ SOLN
8.0000 mg | Freq: Once | INTRAMUSCULAR | Status: AC
  Administered 2023-10-22: 10 mg via INTRAVENOUS

## 2023-10-22 MED ORDER — AMLODIPINE BESYLATE 10 MG PO TABS
10.0000 mg | ORAL_TABLET | Freq: Every day | ORAL | Status: DC
  Filled 2023-10-22: qty 1

## 2023-10-22 MED ORDER — LACTATED RINGERS IV SOLN: INTRAVENOUS | Status: DC

## 2023-10-22 MED ORDER — ROPIVACAINE HCL 7.5 MG/ML IJ SOLN
INTRAMUSCULAR | Status: DC | PRN
  Administered 2023-10-22: 20 mL via PERINEURAL

## 2023-10-22 MED ORDER — METOCLOPRAMIDE HCL 5 MG PO TABS: 5.0000 mg | ORAL_TABLET | Freq: Three times a day (TID) | ORAL | Status: DC | PRN

## 2023-10-22 MED ORDER — ONDANSETRON HCL 4 MG/2ML IJ SOLN: 4.0000 mg | Freq: Once | INTRAMUSCULAR | Status: DC | PRN

## 2023-10-22 MED ORDER — OXYCODONE HCL 5 MG PO TABS
5.0000 mg | ORAL_TABLET | ORAL | Status: DC | PRN
  Filled 2023-10-22: qty 1

## 2023-10-22 MED ORDER — SODIUM CHLORIDE 0.9% FLUSH: 10.0000 mL | Freq: Two times a day (BID) | INTRAVENOUS | Status: DC

## 2023-10-22 MED ORDER — PROPOFOL 500 MG/50ML IV EMUL
INTRAVENOUS | Status: DC | PRN
  Administered 2023-10-22: 75 ug/kg/min via INTRAVENOUS

## 2023-10-22 MED ORDER — DEXAMETHASONE SODIUM PHOSPHATE 10 MG/ML IJ SOLN
INTRAMUSCULAR | Status: AC
  Filled 2023-10-22: qty 1

## 2023-10-22 MED ORDER — MIDAZOLAM HCL 2 MG/2ML IJ SOLN: 2.0000 mg | Freq: Once | INTRAMUSCULAR | Status: DC

## 2023-10-22 MED ORDER — FENTANYL CITRATE PF 50 MCG/ML IJ SOSY
100.0000 ug | PREFILLED_SYRINGE | Freq: Once | INTRAMUSCULAR | Status: AC
  Administered 2023-10-22: 50 ug via INTRAVENOUS
  Filled 2023-10-22: qty 2

## 2023-10-22 MED ORDER — BUPIVACAINE-EPINEPHRINE 0.25% -1:200000 IJ SOLN
INTRAMUSCULAR | Status: AC
  Filled 2023-10-22: qty 1

## 2023-10-22 MED ORDER — STERILE WATER FOR IRRIGATION IR SOLN
Status: DC | PRN
  Administered 2023-10-22: 1000 mL

## 2023-10-22 MED ORDER — METHOCARBAMOL 500 MG PO TABS
500.0000 mg | ORAL_TABLET | Freq: Four times a day (QID) | ORAL | Status: DC | PRN
  Administered 2023-10-22 – 2023-10-23 (×2): 500 mg via ORAL
  Filled 2023-10-22 (×3): qty 1

## 2023-10-22 MED ORDER — METOCLOPRAMIDE HCL 5 MG/ML IJ SOLN: 5.0000 mg | Freq: Three times a day (TID) | INTRAMUSCULAR | Status: DC | PRN

## 2023-10-22 MED ORDER — TRANEXAMIC ACID-NACL 1000-0.7 MG/100ML-% IV SOLN
1000.0000 mg | Freq: Once | INTRAVENOUS | Status: AC
  Administered 2023-10-22: 1000 mg via INTRAVENOUS
  Filled 2023-10-22: qty 100

## 2023-10-22 MED ORDER — SODIUM CHLORIDE 0.9 % IR SOLN
Status: DC | PRN
  Administered 2023-10-22: 1000 mL

## 2023-10-22 MED ORDER — DIPHENHYDRAMINE HCL 12.5 MG/5ML PO ELIX: 12.5000 mg | ORAL_SOLUTION | ORAL | Status: DC | PRN

## 2023-10-22 MED ORDER — SODIUM CHLORIDE 0.9% FLUSH
10.0000 mL | Freq: Two times a day (BID) | INTRAVENOUS | Status: DC
  Administered 2023-10-22 – 2023-10-23 (×3): 10 mL via INTRAVENOUS

## 2023-10-22 MED ORDER — ORAL CARE MOUTH RINSE: 15.0000 mL | Freq: Once | OROMUCOSAL | Status: AC

## 2023-10-22 MED ORDER — HYDROMORPHONE HCL 1 MG/ML IJ SOLN: 0.5000 mg | INTRAMUSCULAR | Status: DC | PRN

## 2023-10-22 MED ORDER — PHENYLEPHRINE HCL (PRESSORS) 10 MG/ML IV SOLN
INTRAVENOUS | Status: AC
  Filled 2023-10-22: qty 1

## 2023-10-22 MED ORDER — KETOROLAC TROMETHAMINE 30 MG/ML IJ SOLN
INTRAMUSCULAR | Status: DC | PRN
  Administered 2023-10-22: 30 mg via INTRAVENOUS

## 2023-10-22 MED ORDER — ASPIRIN 81 MG PO CHEW
81.0000 mg | CHEWABLE_TABLET | Freq: Two times a day (BID) | ORAL | Status: DC
  Administered 2023-10-22 – 2023-10-23 (×2): 81 mg via ORAL
  Filled 2023-10-22 (×3): qty 1

## 2023-10-22 MED ORDER — METOPROLOL SUCCINATE ER 25 MG PO TB24
25.0000 mg | ORAL_TABLET | Freq: Every day | ORAL | Status: DC
  Administered 2023-10-22: 25 mg via ORAL
  Filled 2023-10-22 (×2): qty 1

## 2023-10-22 MED ORDER — BUPIVACAINE-EPINEPHRINE (PF) 0.25% -1:200000 IJ SOLN
INTRAMUSCULAR | Status: DC | PRN
  Administered 2023-10-22: 30 mL

## 2023-10-22 MED ORDER — CEFAZOLIN SODIUM-DEXTROSE 2-4 GM/100ML-% IV SOLN
2.0000 g | INTRAVENOUS | Status: AC
  Administered 2023-10-22: 2 g via INTRAVENOUS
  Filled 2023-10-22: qty 100

## 2023-10-22 MED ORDER — ONDANSETRON HCL 4 MG/2ML IJ SOLN: 4.0000 mg | Freq: Four times a day (QID) | INTRAMUSCULAR | Status: DC | PRN

## 2023-10-22 MED ORDER — CHLORHEXIDINE GLUCONATE 0.12 % MT SOLN
15.0000 mL | Freq: Once | OROMUCOSAL | Status: AC
  Administered 2023-10-22: 15 mL via OROMUCOSAL

## 2023-10-22 MED ORDER — MENTHOL 3 MG MT LOZG: 1.0000 | LOZENGE | OROMUCOSAL | Status: DC | PRN

## 2023-10-22 MED ORDER — METHOCARBAMOL 1000 MG/10ML IJ SOLN: 500.0000 mg | Freq: Four times a day (QID) | INTRAMUSCULAR | Status: DC | PRN

## 2023-10-22 MED ORDER — SENNA 8.6 MG PO TABS
2.0000 | ORAL_TABLET | Freq: Every day | ORAL | Status: DC
  Administered 2023-10-22: 17.2 mg via ORAL
  Filled 2023-10-22: qty 2

## 2023-10-22 MED ORDER — BUPIVACAINE IN DEXTROSE 0.75-8.25 % IT SOLN
INTRATHECAL | Status: DC | PRN
  Administered 2023-10-22: 1.6 mL via INTRATHECAL

## 2023-10-22 MED ORDER — PHENYLEPHRINE HCL-NACL 20-0.9 MG/250ML-% IV SOLN
INTRAVENOUS | Status: DC | PRN
  Administered 2023-10-22: 40 ug/min via INTRAVENOUS

## 2023-10-22 MED ORDER — ACETAMINOPHEN 500 MG PO TABS
1000.0000 mg | ORAL_TABLET | Freq: Four times a day (QID) | ORAL | Status: DC
Start: 2023-10-22 — End: 2023-10-26
  Administered 2023-10-22 – 2023-10-23 (×3): 1000 mg via ORAL
  Filled 2023-10-22 (×4): qty 2

## 2023-10-22 SURGICAL SUPPLY — 46 items
ATTUNE MED ANAT PAT 35 KNEE (Knees) IMPLANT
BAG COUNTER SPONGE SURGICOUNT (BAG) IMPLANT
BAG ZIPLOCK 12X15 (MISCELLANEOUS) IMPLANT
BASEPLATE TIB CMT FB PCKT SZ4 (Stem) IMPLANT
BLADE SAW SGTL 13.0X1.19X90.0M (BLADE) ×1 IMPLANT
BNDG ELASTIC 6INX 5YD STR LF (GAUZE/BANDAGES/DRESSINGS) ×1 IMPLANT
BOWL SMART MIX CTS (DISPOSABLE) ×1 IMPLANT
CEMENT HV SMART SET (Cement) ×2 IMPLANT
COMP FEM CMT ATTUNE KNEE 4 RT (Joint) ×1 IMPLANT
COMPONENT FEM CMT ATTN KN 4 RT (Joint) IMPLANT
COVER SURGICAL LIGHT HANDLE (MISCELLANEOUS) ×1 IMPLANT
CUFF TRNQT CYL 34X4.125X (TOURNIQUET CUFF) ×1 IMPLANT
DERMABOND ADVANCED .7 DNX12 (GAUZE/BANDAGES/DRESSINGS) ×1 IMPLANT
DRAPE U-SHAPE 47X51 STRL (DRAPES) ×1 IMPLANT
DRESSING AQUACEL AG SP 3.5X10 (GAUZE/BANDAGES/DRESSINGS) ×1 IMPLANT
DRSG AQUACEL AG ADV 3.5X10 (GAUZE/BANDAGES/DRESSINGS) IMPLANT
DRSG AQUACEL AG SP 3.5X10 (GAUZE/BANDAGES/DRESSINGS) ×1
DURAPREP 26ML APPLICATOR (WOUND CARE) ×2 IMPLANT
ELECT REM PT RETURN 15FT ADLT (MISCELLANEOUS) ×1 IMPLANT
GLOVE BIO SURGEON STRL SZ 6 (GLOVE) ×1 IMPLANT
GLOVE BIOGEL PI IND STRL 6.5 (GLOVE) ×1 IMPLANT
GLOVE BIOGEL PI IND STRL 7.5 (GLOVE) ×1 IMPLANT
GLOVE ORTHO TXT STRL SZ7.5 (GLOVE) ×2 IMPLANT
GOWN STRL REUS W/ TWL LRG LVL3 (GOWN DISPOSABLE) ×2 IMPLANT
HOLDER FOLEY CATH W/STRAP (MISCELLANEOUS) IMPLANT
INSERT TIB ATTUNE KNEE 4 8 RT (Insert) IMPLANT
KIT TURNOVER KIT A (KITS) IMPLANT
MANIFOLD NEPTUNE II (INSTRUMENTS) ×1 IMPLANT
NDL SAFETY ECLIPSE 18X1.5 (NEEDLE) IMPLANT
NS IRRIG 1000ML POUR BTL (IV SOLUTION) ×1 IMPLANT
PACK TOTAL KNEE CUSTOM (KITS) ×1 IMPLANT
PIN FIX SIGMA LCS THRD HI (PIN) IMPLANT
PROTECTOR NERVE ULNAR (MISCELLANEOUS) ×1 IMPLANT
SET HNDPC FAN SPRY TIP SCT (DISPOSABLE) ×1 IMPLANT
SET PAD KNEE POSITIONER (MISCELLANEOUS) ×1 IMPLANT
SPIKE FLUID TRANSFER (MISCELLANEOUS) ×2 IMPLANT
SUT MNCRL AB 4-0 PS2 18 (SUTURE) ×1 IMPLANT
SUT STRATAFIX PDS+ 0 24IN (SUTURE) ×1 IMPLANT
SUT VIC AB 1 CT1 36 (SUTURE) ×1 IMPLANT
SUT VIC AB 2-0 CT1 TAPERPNT 27 (SUTURE) ×2 IMPLANT
SYR 3ML LL SCALE MARK (SYRINGE) ×1 IMPLANT
TOWEL GREEN STERILE FF (TOWEL DISPOSABLE) ×1 IMPLANT
TRAY FOLEY MTR SLVR 16FR STAT (SET/KITS/TRAYS/PACK) ×1 IMPLANT
TUBE SUCTION HIGH CAP CLEAR NV (SUCTIONS) ×1 IMPLANT
WATER STERILE IRR 1000ML POUR (IV SOLUTION) ×2 IMPLANT
WRAP KNEE MAXI GEL POST OP (GAUZE/BANDAGES/DRESSINGS) ×1 IMPLANT

## 2023-10-22 NOTE — Evaluation (Signed)
Physical Therapy Evaluation Patient Details Name: Laura Rocha MRN: [id number] DOB: March 06, 1932 Today's Date: 10/22/2023  History of Present Illness  89 yo female s/p R TKA 10/22/23. Hx of L TKA 2015, anemia, neuropathy, osteopenia  Clinical Impression  On eval POD 0, pt was CGA for mobility. She walked ~50 feet with a RW. Pt reported mild knee pain. She complained mostly of R foot cramping feeling. Will plan to follow and progress activity as tolerated.         If plan is discharge home, recommend the following: A little help with walking and/or transfers;A little help with bathing/dressing/bathroom;Assistance with cooking/housework;Assist for transportation;Help with stairs or ramp for entrance   Can travel by private vehicle        Equipment Recommendations None recommended by PT  Recommendations for Other Services       Functional Status Assessment Patient has had a recent decline in their functional status and demonstrates the ability to make significant improvements in function in a reasonable and predictable amount of time.     Precautions / Restrictions Precautions Precautions: Fall Restrictions Weight Bearing Restrictions: Yes RLE Weight Bearing: Weight bearing as tolerated      Mobility  Bed Mobility Overal bed mobility: Needs Assistance Bed Mobility: Supine to Sit     Supine to sit: Supervision     General bed mobility comments: Supv for safety.    Transfers Overall transfer level: Needs assistance Equipment used: Rolling walker (2 wheels) Transfers: Sit to/from Stand Sit to Stand: Contact guard assist           General transfer comment: Cues for safety, technique, hand/LE placement.    Ambulation/Gait Ambulation/Gait assistance: Contact guard assist Gait Distance (Feet): 50 Feet Assistive device: Rolling walker (2 wheels)         General Gait Details: Cues for safety, sequencing. No LOB with RW use. No dizziness.  Stairs             Wheelchair Mobility     Tilt Bed    Modified Rankin (Stroke Patients Only)       Balance Overall balance assessment: Needs assistance         Standing balance support: Reliant on assistive device for balance, Bilateral upper extremity supported, During functional activity Standing balance-Leahy Scale: Fair                               Pertinent Vitals/Pain Pain Assessment Pain Assessment: 0-10 Pain Score: 2  Pain Location: R foot Pain Descriptors / Indicators: Cramping Pain Intervention(s): Limited activity within patient's tolerance, Monitored during session, Ice applied, Repositioned    Home Living Family/patient expects to be discharged to:: Private residence Living Arrangements: Alone Available Help at Discharge: Family Type of Home: House Home Access: Level entry     Alternate Level Stairs-Number of Steps: 5 Home Layout: Able to live on main level with bedroom/bathroom Home Equipment: Agricultural consultant (2 wheels);Cane - single point      Prior Function Prior Level of Function : Independent/Modified Independent                     Extremity/Trunk Assessment   Upper Extremity Assessment Upper Extremity Assessment: Overall WFL for tasks assessed    Lower Extremity Assessment Lower Extremity Assessment: Generalized weakness    Cervical / Trunk Assessment Cervical / Trunk Assessment: Normal  Communication   Communication Communication: No apparent difficulties  Cognition Arousal: Alert Behavior  During Therapy: WFL for tasks assessed/performed Overall Cognitive Status: Within Functional Limits for tasks assessed                                          General Comments      Exercises     Assessment/Plan    PT Assessment Patient needs continued PT services  PT Problem List Decreased strength;Decreased range of motion;Decreased activity tolerance;Decreased balance;Decreased mobility;Decreased knowledge  of use of DME;Pain       PT Treatment Interventions DME instruction;Gait training;Functional mobility training;Therapeutic activities;Therapeutic exercise;Patient/family education;Balance training;Stair training    PT Goals (Current goals can be found in the Care Plan section)  Acute Rehab PT Goals Patient Stated Goal: home. regain plof/independence PT Goal Formulation: With patient Time For Goal Achievement: 11/05/23 Potential to Achieve Goals: Good    Frequency 7X/week     Co-evaluation               AM-PAC PT "6 Clicks" Mobility  Outcome Measure Help needed turning from your back to your side while in a flat bed without using bedrails?: A Little Help needed moving from lying on your back to sitting on the side of a flat bed without using bedrails?: A Little Help needed moving to and from a bed to a chair (including a wheelchair)?: A Little Help needed standing up from a chair using your arms (e.g., wheelchair or bedside chair)?: A Little Help needed to walk in hospital room?: A Little Help needed climbing 3-5 steps with a railing? : A Little 6 Click Score: 18    End of Session Equipment Utilized During Treatment: Gait belt Activity Tolerance: Patient tolerated treatment well Patient left: in chair;with call bell/phone within reach;with family/visitor present;with chair alarm set   PT Visit Diagnosis: Pain;Other abnormalities of gait and mobility (R26.89) Pain - Right/Left: Right Pain - part of body: Knee    Time: 1610-9604 PT Time Calculation (min) (ACUTE ONLY): 17 min   Charges:   PT Evaluation $PT Eval Low Complexity: 1 Low   PT General Charges $$ ACUTE PT VISIT: 1 Visit           Faye Ramsay, PT Acute Rehabilitation  Office: [phone]

## 2023-10-22 NOTE — Interval H&P Note (Signed)
History and Physical Interval Note:  10/22/2023 6:46 AM  Laura Rocha  has presented today for surgery, with the diagnosis of Right knee osteoarthritis.  The various methods of treatment have been discussed with the patient and family. After consideration of risks, benefits and other options for treatment, the patient has consented to  Procedure(s): TOTAL KNEE ARTHROPLASTY (Right) as a surgical intervention.  The patient's history has been reviewed, patient examined, no change in status, stable for surgery.  I have reviewed the patient's chart and labs.  Questions were answered to the patient's satisfaction.     [name]

## 2023-10-22 NOTE — Care Plan (Signed)
Ortho Bundle Case Management Note  Patient Details  Name: Laura Rocha MRN: [id number] Date of Birth: 1932/06/19                  R TKA on 10/22/23.  DCP: Home with daughter.  DME: No needs. Has RW and cane.  PT: Ilean China 12/2   DME Arranged:  N/A DME Agency:       Additional Comments: Please contact me with any questions of if this plan should need to change.    Despina Pole, CCM Case Manager, [facility] 10/22/2023, 8:09 AM

## 2023-10-22 NOTE — Transfer of Care (Signed)
Immediate Anesthesia Transfer of Care Note  Patient: Laura Rocha  Procedure(s) Performed: TOTAL KNEE ARTHROPLASTY (Right: Knee)  Patient Location: PACU  Anesthesia Type:Spinal  Level of Consciousness: sedated, patient cooperative, and responds to stimulation  Airway & Oxygen Therapy: Patient Spontanous Breathing and Patient connected to face mask oxygen  Post-op Assessment: Report given to RN and Post -op Vital signs reviewed and stable  Post vital signs: Reviewed and stable  Last Vitals:  Vitals Value Taken Time  BP 110/49 10/22/23 1007  Temp    Pulse 70 10/22/23 1009  Resp 17 10/22/23 1009  SpO2 100 % 10/22/23 1009  Vitals shown include unfiled device data.  Last Pain:  Vitals:   10/22/23 0641  TempSrc: Oral  PainSc:          Complications: No notable events documented.

## 2023-10-22 NOTE — Op Note (Signed)
NAME:  Laura Rocha                      MEDICAL RECORD NO.:  [id number]                             FACILITY:  [facility]      PHYSICIAN:  Madlyn Frankel. Charlann Boxer, M.D.  DATE OF BIRTH:  01-23-1932      DATE OF PROCEDURE:  10/22/2023                                     OPERATIVE REPORT         PREOPERATIVE DIAGNOSIS:  Right knee osteoarthritis.      POSTOPERATIVE DIAGNOSIS:  Right knee osteoarthritis.      FINDINGS:  The patient was noted to have complete loss of cartilage and   bone-on-bone arthritis with associated osteophytes in the medial and patellofemoral compartments of   the knee with a significant synovitis and associated effusion.  The patient had failed months of conservative treatment including medications, injection therapy, activity modification.     PROCEDURE:  Right total knee replacement.      COMPONENTS USED:  DePuy Attune FB CR MS knee   system, a size 4 femur, 4 tibia, size 8 mm CR MS AOX insert, and 35 anatomic patellar   button.      SURGEON:  Madlyn Frankel. Charlann Boxer, M.D.      ASSISTANT:  Rosalene Billings, PA-C.      ANESTHESIA:  Regional and Spinal.      SPECIMENS:  None.      COMPLICATION:  None.      DRAINS:  None.  EBL: <100 cc      TOURNIQUET TIME:  27 min at 225 mmHG     The patient was stable to the recovery room.      INDICATION FOR PROCEDURE:  Laura Rocha is a 89 y.o. female patient of   mine.  The patient had been seen, evaluated, and treated for months conservatively in the   office with medication, activity modification, and injections.  The patient had   radiographic changes of bone-on-bone arthritis with endplate sclerosis and osteophytes noted.  Based on the radiographic changes and failed conservative measures, the patient   decided to proceed with definitive treatment, total knee replacement.  Risks of infection, DVT, component failure, need for revision surgery, neurovascular injury were reviewed in the office setting.  The postop course was  reviewed stressing the efforts to maximize post-operative satisfaction and function.  Consent was obtained for benefit of pain   relief.      PROCEDURE IN DETAIL:  The patient was brought to the operative theater.   Once adequate anesthesia, preoperative antibiotics, 2 gm of Ancef,1 gm of Tranexamic Acid, and 10 mg of Decadron administered, the patient was positioned supine with a right thigh tourniquet placed.  The  right lower extremity was prepped and draped in sterile fashion.  A time-   out was performed identifying the patient, planned procedure, and the appropriate extremity.      The right lower extremity was placed in the [facility] leg holder.  The leg was   exsanguinated, tourniquet elevated to 225 mmHg.  A midline incision was   made followed by median parapatellar arthrotomy.  Following initial   exposure, attention was first directed  to the patella.  Precut   measurement was noted to be 22 mm.  I resected down to 12 mm and used a   35 anatomic patellar button to restore patellar height as well as cover the cut surface.      The lug holes were drilled and a metal shim was placed to protect the   patella from retractors and saw blade during the procedure.      At this point, attention was now directed to the femur.  The femoral   canal was opened with a drill, irrigated to try to prevent fat emboli.  An   intramedullary rod was passed at 3 degrees valgus, 9 mm of bone was   resected off the distal femur.  Following this resection, the tibia was   subluxated anteriorly.  Using the extramedullary guide, 2 mm of bone was resected off   the proximal medial tibia.  We confirmed the gap would be   stable medially and laterally with a size 6 spacer block as well as confirmed that the tibial cut was perpendicular in the coronal plane, checking with an alignment rod.      Once this was done, I sized the femur to be a size 4 in the anterior-   posterior dimension, chose a standard component  based on medial and   lateral dimension.  The size 4 rotation block was then pinned in   position anterior referenced using the C-clamp to set rotation.  The   anterior, posterior, and  chamfer cuts were made without difficulty nor   notching making certain that I was along the anterior cortex to help   with flexion gap stability.      The final box cut was made off the lateral aspect of distal femur.      At this point, the tibia was sized to be a size 4.  The size 4 tray was   then pinned in position through the medial third of the tubercle,   drilled, and keel punched.  Trial reduction was now carried with a 4 femur,  4 tibia, a size 8 mm CR MS insert, and the 35 anatomic patella botton.  The knee was brought to full extension with good flexion stability with the patella   tracking through the trochlea without application of pressure.  Given   all these findings the trial components removed.  Final components were   opened and cement was mixed.  The knee was irrigated with normal saline solution and pulse lavage.  The synovial lining was   then injected with 30 cc of 0.25% Marcaine with epinephrine, 1 cc of Toradol and 30 cc of NS for a total of 61 cc.     Final implants were then cemented onto cleaned and dried cut surfaces of bone with the knee brought to extension with a size 8 mm CR MS trial insert.      Once the cement had fully cured, excess cement was removed   throughout the knee.  I confirmed that I was satisfied with the range of   motion and stability, and the final size 8 mm CR MS AOX insert was chosen.  It was   placed into the knee.      The tourniquet had been let down at 27 minutes.  No significant   hemostasis was required.  The extensor mechanism was then reapproximated using #1 Vicryl and #1 Stratafix sutures with the knee   in flexion.  The  remaining wound was closed with 2-0 Vicryl and running 4-0 Monocryl.   The knee was cleaned, dried, dressed sterilely using  Dermabond and   Aquacel dressing.  The patient was then   brought to recovery room in stable condition, tolerating the procedure   well.   Please note that Physician Assistant, Rosalene Billings, PA-C was present for the entirety of the case, and was utilized for pre-operative positioning, peri-operative retractor management, general facilitation of the procedure and for primary wound closure at the end of the case.              Madlyn Frankel Charlann Boxer, M.D.    10/22/2023 6:46 AM

## 2023-10-22 NOTE — Discharge Instructions (Signed)

## 2023-10-22 NOTE — Anesthesia Postprocedure Evaluation (Signed)
Anesthesia Post Note  Patient: Laura Rocha  Procedure(s) Performed: TOTAL KNEE ARTHROPLASTY (Right: Knee)     Patient location during evaluation: PACU Anesthesia Type: Spinal Level of consciousness: oriented and awake and alert Pain management: pain level controlled Vital Signs Assessment: post-procedure vital signs reviewed and stable Respiratory status: spontaneous breathing, respiratory function stable and nonlabored ventilation Cardiovascular status: blood pressure returned to baseline and stable Postop Assessment: no headache, no backache, no apparent nausea or vomiting, spinal receding and patient able to bend at knees Anesthetic complications: no   No notable events documented.  Last Vitals:  Vitals:   10/22/23 1140 10/22/23 1323  BP: 125/69 134/72  Pulse: 65 70  Resp: 16 15  Temp: 36.4 C (!) 36.3 C  SpO2: 94% 99%    Last Pain:  Vitals:   10/22/23 1140  TempSrc: Oral  PainSc: 0-No pain    LLE Motor Response: Purposeful movement (10/22/23 1340) LLE Sensation: Full sensation (10/22/23 1340) RLE Motor Response: Purposeful movement (10/22/23 1340) RLE Sensation: Full sensation (10/22/23 1340) L Sensory Level: S1-Sole of foot, small toes (10/22/23 1340) R Sensory Level: S1-Sole of foot, small toes (10/22/23 1340)  [name] A.

## 2023-10-22 NOTE — Anesthesia Procedure Notes (Signed)
Anesthesia Regional Block: Adductor canal block   Pre-Anesthetic Checklist: , timeout performed,  Correct Patient, Correct Site, Correct Laterality,  Correct Procedure, Correct Position, site marked,  Risks and benefits discussed,  Surgical consent,  Pre-op evaluation,  At surgeon's request and post-op pain management  Laterality: Right  Prep: chloraprep       Needles:  Injection technique: Single-shot  Needle Type: Echogenic Stimulator Needle     Needle Length: 10cm  Needle Gauge: 21   Needle insertion depth: 7 cm   Additional Needles:   Procedures:,,,, ultrasound used (permanent image in chart),,    Narrative:  Start time: 10/22/2023 8:16 AM End time: 10/22/2023 8:21 AM Injection made incrementally with aspirations every 5 mL.  Performed by: Personally  Anesthesiologist: Mal Amabile, MD  Additional Notes: Timeout performed. Patient sedated. Relevant anatomy ID'd using [place]. Incremental 2-73ml injection of LA with frequent aspiration. Patient tolerated procedure well.

## 2023-10-22 NOTE — Anesthesia Procedure Notes (Signed)
Spinal  Patient location during procedure: OR Start time: 10/22/2023 8:37 AM End time: 10/22/2023 8:41 AM Reason for block: surgical anesthesia Staffing Performed: anesthesiologist  Anesthesiologist: Mal Amabile, MD Performed by: Mal Amabile, MD Authorized by: Mal Amabile, MD   Preanesthetic Checklist Completed: patient identified, IV checked, site marked, risks and benefits discussed, surgical consent, monitors and equipment checked, pre-op evaluation and timeout performed Spinal Block Patient position: sitting Prep: DuraPrep and site prepped and draped Patient monitoring: heart rate, cardiac monitor, continuous pulse ox and blood pressure Approach: midline Location: L3-4 Injection technique: single-shot Needle Needle type: Pencan  Needle gauge: 24 G Needle length: 9 cm Needle insertion depth: 6 cm Assessment Sensory level: T8 Events: CSF return Additional Notes Patient tolerated procedure well. Adequate sensory level.

## 2023-10-23 ENCOUNTER — Encounter (HOSPITAL_COMMUNITY): Payer: Self-pay | Admitting: Orthopedic Surgery

## 2023-10-23 DIAGNOSIS — M1711 Unilateral primary osteoarthritis, right knee: Secondary | ICD-10-CM | POA: Diagnosis not present

## 2023-10-23 LAB — CBC
HCT: 32.8 % — ABNORMAL LOW (ref 36.0–46.0)
Hemoglobin: 10.6 g/dL — ABNORMAL LOW (ref 12.0–15.0)
MCH: 30.9 pg (ref 26.0–34.0)
MCHC: 32.3 g/dL (ref 30.0–36.0)
MCV: 95.6 fL (ref 80.0–100.0)
Platelets: 145 10*3/uL — ABNORMAL LOW (ref 150–400)
RBC: 3.43 MIL/uL — ABNORMAL LOW (ref 3.87–5.11)
RDW: 13.3 % (ref 11.5–15.5)
WBC: 10.5 10*3/uL (ref 4.0–10.5)
nRBC: 0 % (ref 0.0–0.2)

## 2023-10-23 LAB — BASIC METABOLIC PANEL WITH GFR
Anion gap: 8 (ref 5–15)
BUN: 28 mg/dL — ABNORMAL HIGH (ref 8–23)
CO2: 25 mmol/L (ref 22–32)
Calcium: 9.2 mg/dL (ref 8.9–10.3)
Chloride: 106 mmol/L (ref 98–111)
Creatinine, Ser: 1.06 mg/dL — ABNORMAL HIGH (ref 0.44–1.00)
GFR, Estimated: 50 mL/min — ABNORMAL LOW (ref >60)
Glucose, Bld: 133 mg/dL — ABNORMAL HIGH (ref 70–99)
Potassium: 4.3 mmol/L (ref 3.5–5.1)
Sodium: 139 mmol/L (ref 135–145)

## 2023-10-23 MED ORDER — SENNA 8.6 MG PO TABS: 2.0000 | ORAL_TABLET | Freq: Every day | ORAL | 0 refills | Status: AC

## 2023-10-23 MED ORDER — ASPIRIN 81 MG PO CHEW: 81.0000 mg | CHEWABLE_TABLET | Freq: Two times a day (BID) | ORAL | 0 refills | Status: AC

## 2023-10-23 MED ORDER — POLYETHYLENE GLYCOL 3350 17 G PO PACK: 17.0000 g | PACK | Freq: Two times a day (BID) | ORAL | 0 refills | Status: AC

## 2023-10-23 MED ORDER — OXYCODONE HCL 5 MG PO TABS: 2.5000 mg | ORAL_TABLET | ORAL | 0 refills | Status: AC | PRN

## 2023-10-23 MED ORDER — ONDANSETRON HCL 4 MG PO TABS: 4.0000 mg | ORAL_TABLET | Freq: Four times a day (QID) | ORAL | 0 refills | Status: AC | PRN

## 2023-10-23 MED ORDER — MELOXICAM 15 MG PO TABS: 15.0000 mg | ORAL_TABLET | Freq: Every day | ORAL | 0 refills | Status: AC

## 2023-10-23 MED ORDER — METHOCARBAMOL 500 MG PO TABS: 500.0000 mg | ORAL_TABLET | Freq: Four times a day (QID) | ORAL | 2 refills | Status: AC | PRN

## 2023-10-23 NOTE — Plan of Care (Signed)
  Problem: Education: Goal: Knowledge of General Education information will improve Description: Including pain rating scale, medication(s)/side effects and non-pharmacologic comfort measures Outcome: Progressing   Problem: Health Behavior/Discharge Planning: Goal: Ability to manage health-related needs will improve Outcome: Progressing   Problem: Clinical Measurements: Goal: Ability to maintain clinical measurements within normal limits will improve Outcome: Progressing Goal: Will remain free from infection Outcome: Progressing Goal: Diagnostic test results will improve Outcome: Progressing Goal: Respiratory complications will improve Outcome: Progressing Goal: Cardiovascular complication will be avoided Outcome: Progressing   Problem: Activity: Goal: Risk for activity intolerance will decrease Outcome: Adequate for Discharge   Problem: Nutrition: Goal: Adequate nutrition will be maintained Outcome: Completed/Met   Problem: Coping: Goal: Level of anxiety will decrease Outcome: Progressing   Problem: Elimination: Goal: Will not experience complications related to bowel motility Outcome: Progressing Goal: Will not experience complications related to urinary retention Outcome: Progressing   Problem: Pain Management: Goal: General experience of comfort will improve Outcome: Progressing   Problem: Safety: Goal: Ability to remain free from injury will improve Outcome: Progressing   Problem: Skin Integrity: Goal: Risk for impaired skin integrity will decrease Outcome: Adequate for Discharge   Problem: Education: Goal: Knowledge of the prescribed therapeutic regimen will improve Outcome: Progressing Goal: Individualized Educational Video(s) Outcome: Completed/Met   Problem: Activity: Goal: Ability to avoid complications of mobility impairment will improve Outcome: Progressing Goal: Range of joint motion will improve Outcome: Adequate for Discharge   Problem:  Clinical Measurements: Goal: Postoperative complications will be avoided or minimized Outcome: Progressing   Problem: Pain Management: Goal: Pain level will decrease with appropriate interventions Outcome: Progressing   Problem: Skin Integrity: Goal: Will show signs of wound healing Outcome: Progressing

## 2023-10-23 NOTE — TOC Transition Note (Signed)
Transition of Care [facility]) - CM/SW Discharge Note   Patient Details  Name: Laura Rocha MRN: [id number] Date of Birth: [birth year]  [facility] CM/SW Contact:  Amada Jupiter, LCSW Phone Number: 10/23/2023, 10:06 AM   Clinical Narrative:     Met with pt and daughter who confirm pt has needed DME in the home.  OPPT already set up with [facility] ([place]).  No further TOC needs  Final next level of care: OP Rehab Barriers to Discharge: No Barriers Identified   Patient Goals and CMS Choice      Discharge Placement                         Discharge Plan and Services Additional resources added to the After Visit Summary for                  DME Arranged: N/A                    Social Determinants of Health (SDOH) Interventions SDOH Screenings   Food Insecurity: No Food Insecurity (10/22/2023)  Housing: Low Risk  (10/22/2023)  Transportation Needs: No Transportation Needs (10/22/2023)  Utilities: Not At Risk (10/22/2023)  Tobacco Use: Low Risk  (10/22/2023)     Readmission Risk Interventions     No data to display

## 2023-10-23 NOTE — Progress Notes (Signed)
Physical Therapy Treatment Patient Details Name: Laura Rocha MRN: [id number] DOB: 1932-07-12 Today's Date: 10/23/2023   History of Present Illness 89 yo female s/p R TKA 10/22/23. Hx of L TKA 2015, anemia, neuropathy, osteopenia    PT Comments  Reviewed/practiced exercises and gait training. Moderate pain with activity on today. Issued HEP for pt to follow at home until she begins OPPT. All PT education completed.     If plan is discharge home, recommend the following: A little help with walking and/or transfers;A little help with bathing/dressing/bathroom;Assistance with cooking/housework;Assist for transportation;Help with stairs or ramp for entrance   Can travel by private vehicle        Equipment Recommendations  None recommended by PT    Recommendations for Other Services       Precautions / Restrictions Precautions Precautions: Fall;Knee Restrictions Weight Bearing Restrictions: No RLE Weight Bearing: Weight bearing as tolerated     Mobility  Bed Mobility               General bed mobility comments: oob in recliner    Transfers Overall transfer level: Needs assistance Equipment used: Rolling walker (2 wheels) Transfers: Sit to/from Stand Sit to Stand: Supervision           General transfer comment: Cues for safety, technique, hand/LE placement.    Ambulation/Gait Ambulation/Gait assistance: Contact guard assist Gait Distance (Feet): 120 Feet Assistive device: Rolling walker (2 wheels) Gait Pattern/deviations: Step-through pattern, Decreased stride length       General Gait Details: No LOB with RW use. No dizziness reported. Pt tolerated distance well.   Stairs             Wheelchair Mobility     Tilt Bed    Modified Rankin (Stroke Patients Only)       Balance Overall balance assessment: Needs assistance         Standing balance support: Reliant on assistive device for balance, Bilateral upper extremity supported,  During functional activity Standing balance-Leahy Scale: Fair                              Cognition Arousal: Alert Behavior During Therapy: WFL for tasks assessed/performed Overall Cognitive Status: Within Functional Limits for tasks assessed                                          Exercises Total Joint Exercises Ankle Circles/Pumps: AROM, Both, 10 reps Quad Sets: AROM, Right, 10 reps Hip ABduction/ADduction: AROM, Right, 10 reps Straight Leg Raises: AROM, Right, 10 reps Knee Flexion: AROM, Right, 10 reps, Seated Goniometric ROM: ~10-90 degrees    General Comments        Pertinent Vitals/Pain Pain Assessment Pain Assessment: Faces Faces Pain Scale: Hurts even more Pain Location: R knee Pain Descriptors / Indicators: Grimacing, Aching Pain Intervention(s): Limited activity within patient's tolerance, Monitored during session, Ice applied, Premedicated before session, Repositioned    Home Living                          Prior Function            PT Goals (current goals can now be found in the care plan section) Progress towards PT goals: Progressing toward goals    Frequency    7X/week  PT Plan      Co-evaluation              AM-PAC PT "6 Clicks" Mobility   Outcome Measure  Help needed turning from your back to your side while in a flat bed without using bedrails?: A Little Help needed moving from lying on your back to sitting on the side of a flat bed without using bedrails?: A Little Help needed moving to and from a bed to a chair (including a wheelchair)?: A Little Help needed standing up from a chair using your arms (e.g., wheelchair or bedside chair)?: A Little Help needed to walk in hospital room?: A Little Help needed climbing 3-5 steps with a railing? : A Little 6 Click Score: 18    End of Session Equipment Utilized During Treatment: Gait belt Activity Tolerance: Patient tolerated treatment  well Patient left: in chair;with call bell/phone within reach;with family/visitor present   PT Visit Diagnosis: Pain;Other abnormalities of gait and mobility (R26.89) Pain - Right/Left: Right Pain - part of body: Knee     Time: 1021-1038 PT Time Calculation (min) (ACUTE ONLY): 17 min  Charges:    $Gait Training: 8-22 mins PT General Charges $$ ACUTE PT VISIT: 1 Visit                         Faye Ramsay, PT Acute Rehabilitation  Office: [phone]

## 2023-10-23 NOTE — Care Management Obs Status (Signed)
MEDICARE OBSERVATION STATUS NOTIFICATION   Patient Details  Name: Laura Rocha MRN: [id number] Date of Birth: 02-02-1932   Medicare Observation Status Notification Given:  Yes    Amada Jupiter, LCSW 10/23/2023, 1:40 PM

## 2023-10-31 DIAGNOSIS — R262 Difficulty in walking, not elsewhere classified: Secondary | ICD-10-CM | POA: Diagnosis not present

## 2023-10-31 DIAGNOSIS — M25561 Pain in right knee: Secondary | ICD-10-CM | POA: Diagnosis not present
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
## 2023-11-06 DIAGNOSIS — R262 Difficulty in walking, not elsewhere classified: Secondary | ICD-10-CM | POA: Diagnosis not present

## 2023-11-06 DIAGNOSIS — M25561 Pain in right knee: Secondary | ICD-10-CM | POA: Diagnosis not present

## 2023-11-07 DIAGNOSIS — M25561 Pain in right knee: Secondary | ICD-10-CM | POA: Diagnosis not present

## 2023-11-07 DIAGNOSIS — R262 Difficulty in walking, not elsewhere classified: Secondary | ICD-10-CM | POA: Diagnosis not present

## 2023-11-14 DIAGNOSIS — M25561 Pain in right knee: Secondary | ICD-10-CM | POA: Diagnosis not present

## 2023-11-14 DIAGNOSIS — R262 Difficulty in walking, not elsewhere classified: Secondary | ICD-10-CM | POA: Diagnosis not present

## 2023-11-18 DIAGNOSIS — R262 Difficulty in walking, not elsewhere classified: Secondary | ICD-10-CM | POA: Diagnosis not present

## 2023-11-18 DIAGNOSIS — M25561 Pain in right knee: Secondary | ICD-10-CM | POA: Diagnosis not present

## 2023-11-21 DIAGNOSIS — M25561 Pain in right knee: Secondary | ICD-10-CM | POA: Diagnosis not present

## 2023-11-21 DIAGNOSIS — R262 Difficulty in walking, not elsewhere classified: Secondary | ICD-10-CM | POA: Diagnosis not present

## 2023-11-29 DIAGNOSIS — Z96651 Presence of right artificial knee joint: Secondary | ICD-10-CM | POA: Diagnosis not present

## 2023-11-29 DIAGNOSIS — Z471 Aftercare following joint replacement surgery: Secondary | ICD-10-CM | POA: Diagnosis not present

## 2024-02-13 DIAGNOSIS — E559 Vitamin D deficiency, unspecified: Secondary | ICD-10-CM | POA: Diagnosis not present

## 2024-02-13 DIAGNOSIS — R7301 Impaired fasting glucose: Secondary | ICD-10-CM | POA: Diagnosis not present

## 2024-02-13 DIAGNOSIS — M8589 Other specified disorders of bone density and structure, multiple sites: Secondary | ICD-10-CM | POA: Diagnosis not present

## 2024-02-13 DIAGNOSIS — I491 Atrial premature depolarization: Secondary | ICD-10-CM | POA: Diagnosis not present

## 2024-02-13 DIAGNOSIS — E785 Hyperlipidemia, unspecified: Secondary | ICD-10-CM | POA: Diagnosis not present

## 2024-02-13 DIAGNOSIS — I10 Essential (primary) hypertension: Secondary | ICD-10-CM | POA: Diagnosis not present

## 2024-02-13 DIAGNOSIS — D649 Anemia, unspecified: Secondary | ICD-10-CM | POA: Diagnosis not present

## 2024-08-17 DIAGNOSIS — R7301 Impaired fasting glucose: Secondary | ICD-10-CM | POA: Diagnosis not present

## 2024-08-17 DIAGNOSIS — I491 Atrial premature depolarization: Secondary | ICD-10-CM | POA: Diagnosis not present

## 2024-08-17 DIAGNOSIS — M8589 Other specified disorders of bone density and structure, multiple sites: Secondary | ICD-10-CM | POA: Diagnosis not present

## 2024-08-17 DIAGNOSIS — E785 Hyperlipidemia, unspecified: Secondary | ICD-10-CM | POA: Diagnosis not present

## 2024-08-17 DIAGNOSIS — D649 Anemia, unspecified: Secondary | ICD-10-CM | POA: Diagnosis not present

## 2024-08-17 DIAGNOSIS — E559 Vitamin D deficiency, unspecified: Secondary | ICD-10-CM | POA: Diagnosis not present

## 2024-08-17 DIAGNOSIS — I10 Essential (primary) hypertension: Secondary | ICD-10-CM | POA: Diagnosis not present

## 2024-08-17 DIAGNOSIS — J329 Chronic sinusitis, unspecified: Secondary | ICD-10-CM | POA: Diagnosis not present

## 2024-08-17 DIAGNOSIS — Z6824 Body mass index (BMI) 24.0-24.9, adult: Secondary | ICD-10-CM | POA: Diagnosis not present
